# Patient Record
Sex: Female | Born: 1990
Health system: Southern US, Community
[De-identification: ages and names within clinical notes are randomized; demographics above are authoritative.]

## PROBLEM LIST (undated history)

## (undated) DIAGNOSIS — J302 Other seasonal allergic rhinitis: Secondary | ICD-10-CM

## (undated) DIAGNOSIS — J329 Chronic sinusitis, unspecified: Secondary | ICD-10-CM

---

## 2009-03-22 ENCOUNTER — Ambulatory Visit: Payer: Self-pay | Admitting: Internal Medicine

## 2010-06-15 ENCOUNTER — Emergency Department: Payer: Self-pay | Admitting: Unknown Physician Specialty

## 2012-04-18 ENCOUNTER — Observation Stay: Payer: Self-pay | Admitting: Obstetrics and Gynecology

## 2012-04-18 LAB — CBC WITH DIFFERENTIAL/PLATELET
Basophil #: 0 10*3/uL (ref 0.0–0.1)
Basophil %: 0.3 %
Eosinophil #: 0 10*3/uL (ref 0.0–0.7)
Eosinophil %: 0.4 %
HCT: 33.1 % — ABNORMAL LOW (ref 35.0–47.0)
Lymphocyte #: 1.5 10*3/uL (ref 1.0–3.6)
Lymphocyte %: 18.5 %
MCH: 28.6 pg (ref 26.0–34.0)
MCV: 88 fL (ref 80–100)
Monocyte #: 0.6 x10 3/mm (ref 0.2–0.9)
Neutrophil #: 6 10*3/uL (ref 1.4–6.5)
RDW: 12.7 % (ref 11.5–14.5)

## 2012-05-09 ENCOUNTER — Ambulatory Visit: Payer: Self-pay | Admitting: Obstetrics and Gynecology

## 2012-05-09 LAB — CBC WITH DIFFERENTIAL/PLATELET
Eosinophil %: 0.4 %
HCT: 35.4 % (ref 35.0–47.0)
MCH: 29.7 pg (ref 26.0–34.0)
MCHC: 34 g/dL (ref 32.0–36.0)
Monocyte %: 8.9 %
Neutrophil #: 6.8 10*3/uL — ABNORMAL HIGH (ref 1.4–6.5)
Neutrophil %: 70 %
Platelet: 290 10*3/uL (ref 150–440)
WBC: 9.8 10*3/uL (ref 3.6–11.0)

## 2012-05-10 ENCOUNTER — Inpatient Hospital Stay: Payer: Self-pay | Admitting: Obstetrics and Gynecology

## 2012-05-11 LAB — HEMATOCRIT: HCT: 30 % — ABNORMAL LOW (ref 35.0–47.0)

## 2014-08-02 NOTE — Op Note (Signed)
PATIENT NAME:  Margaret Wagner, Margaret Wagner MR#:  [id number] DATE OF BIRTH:  01/15/1991  DATE OF PROCEDURE:  05/10/2012  PREOPERATIVE DIAGNOSES:  1. Term intrauterine pregnancy at 39 weeks, three days gestation.  2. Homero Fellers breech presentation.   POSTOPERATIVE DIAGNOSES: 1. Term intrauterine pregnancy at 39 weeks, three days gestation.  2. Homero Fellers breech presentation.   OPERATION PERFORMED: Primary low transverse c-section via Pfannenstiel skin incision.   ANESTHESIA USED: Spinal.  PRIMARY SURGEON: Florina Ou. Bonney Aid, MD  ASSISTANT: Bobbye Charleston, MD  PREOPERATIVE ANTIBIOTICS: Ancef 2 grams.  ESTIMATED BLOOD LOSS: 600 mL   OPERATIVE FLUIDS: 750 mL of crystalloid.   URINE OUTPUT: 150 mL of clear urine.   COMPLICATIONS: None.   INTRAOPERATIVE FINDINGS: Normal tubes, ovaries, and uterus. Nuchal cord x 1 was encountered during the delivery. The infant was in the frank breech presentation as had been noted on prior ultrasounds. The delivery resulted in the birth of a live-born female infant weighing 3130 grams, 6 pounds 14 ounces, Apgars 9 and 9.   DRAINS OR TUBES: Foley to gravity drainage, On-Q catheter system.   IMPLANTS: None.   SPECIMENS REMOVED: None.   CONDITION FOLLOWING PROCEDURE: Stable.   COMPLICATIONS: None.   PROCEDURE IN DETAIL: The risks, benefits, and alternatives of the procedure were discussed with the patient prior to proceeding to the Operating Room. The patient had previously undergone a failed attempt at version approximately three weeks prior to her C-section. The fetus did not overt spontaneously and the decision was made to proceed with c-section for mode of delivery. After being taken to the Operating Room, spinal anesthesia was administered and the patient was positioned in the supine position. She was prepped and draped in the usual sterile fashion. A time-out was performed. The level of anesthetic was checked and noted to be adequate to proceed with the procedure. A  Pfannenstiel skin incision was made 2 cm above the pubic symphysis, carried down sharply to the level of the rectus fascia which was incised in the midline using a knife. The fascial incision was then extended using Mayo scissors. The superior border of the rectus fascia was grasped with 2 clamps. The underlying rectus muscles were dissected off the fascia using blunt dissection and the median raphe incised using Mayo scissors. The inferior border of the rectus fascia was dissected off the rectus muscle in a similar fashion. The midline was identified. The peritoneum was entered bluntly. The peritoneal incision was extended using manual traction. A bladder blade was then placed, and the bladder flap was created using Metzenbaum scissors and digital dissection. The bladder blade was then replaced to retract the bladder caudad. A hysterotomy incision was then made, low transverse, on the uterus. After scoring the uterus, the uterus was entered bluntly using the operator's finger. Amniotic fluid was noted to be clear. The infant was noted to be in the frank breech position. The breech was grasped, brought to the incision, and delivered atraumatically using fundal pressure. The left arm was then splinted, swept across the infant's chest, and the infant was rotated 180 degrees, and the right arm was delivered splinting it as well. Following delivery of the arms, the infant's head was flexed using Mauriceau-Smellie-Veit maneuver, and the head was delivered atraumatically using fundal pressure. The infant was suctioned, the cord was clamped and cut, and the infant was passed to a waiting pediatrician. Cord blood was obtained. The placenta was delivered using manual extraction. The uterus was then exteriorized, wiped clean of clots and debris,  and the hysterotomy incision was repaired using a two-layer closure of 0 Vicryl with the first layer being a running locked layer and the second a vertical imbricating. Following  hysterotomy closure, the uterus was returned to the abdomen, and the hysterotomy was reinspected and noted to be hemostatic. The peritoneal gutters were wiped clean of clots and debris using two moist laps. The peritoneum was closed using a 2-0 Vicryl in a running fashion. The On-Q catheter system was then placed superior to the incision in the usual fashion. Following On-Q catheter placement, the fascia was closed using a #1 looped PDS in a running fashion. Subcutaneous tissue was irrigated and hemostasis achieved using the Bovie. The skin was closed using Ensorb staples. Each On-Q catheter was then bolused with 5 mL of 0.5% bupivacaine each. The sponge, needle, and instrument counts were correct x 2. The patient tolerated the procedure well and was taken to the Recovery Room in stable condition.   ____________________________ Florina OuAndreas M. Bonney AidStaebler, MD ams:jm D: 05/12/2012 17:37:25 ET T: 05/13/2012 15:17:27 ET JOB#: [id number]  cc: Florina OuAndreas M. Bonney AidStaebler, MD, <Dictator> Lorrene ReidANDREAS M Orby Tangen MD ELECTRONICALLY SIGNED 05/19/2012 20:39

## 2014-08-20 NOTE — H&P (Signed)
L&D Evaluation:  History:   HPI 24 yo G1 at 6436 weeks gestation presenting for attempted external cephalic version.  Patient noted to be breech on in office US yesterday and confrimed to still be breech on bedside US today    Presents with External cephalic version    Patient's Medical History No Chronic Illness    Patient's Surgical History none    Medications Pre Natal Vitamins    Allergies NKDA    Social History none    Family History Non-Contributory   ROS:   ROS All systems were reviewed.  HEENT, CNS, GI, GU, Respiratory, CV, Renal and Musculoskeletal systems were found to be normal.   Exam:   Vital Signs stable    Urine Protein not completed    General no apparent distress    Abdomen gravid, non-tender    Estimated Fetal Weight Average for gestational age    Fetal Position breech    Edema no edema    Other Bedside US - single viable IUP, breech, anterior placenta, AFI of 16.8cm   Impression:   Impression External cephalic version   Plan:   Comments 1) Version     - monitor pre and post version (1-hr post)     - Terbutaline SQ 10 min prior     - AFI 16.8cm, normal size fetus 6lbs by leopolds     - CBC and hold sample   Electronic Signatures: Lorrene ReidStaebler, Lowen Barringer M (MD)  (Signed 07-Jan-14 12:50)  Authored: L&D Evaluation   Last Updated: 07-Jan-14 12:50 by Lorrene ReidStaebler, Lanitra Battaglini M (MD)

## 2014-09-27 ENCOUNTER — Ambulatory Visit
Admission: EM | Admit: 2014-09-27 | Discharge: 2014-09-27 | Disposition: A | Discharge status: Home / Self Care | Payer: BLUE CROSS/BLUE SHIELD | Attending: Family Medicine | Admitting: Family Medicine

## 2014-09-27 ENCOUNTER — Encounter: Payer: Self-pay | Admitting: Emergency Medicine

## 2014-09-27 DIAGNOSIS — J0111 Acute recurrent frontal sinusitis: Secondary | ICD-10-CM | POA: Diagnosis not present

## 2014-09-27 HISTORY — DX: Chronic sinusitis, unspecified: J32.9

## 2014-09-27 HISTORY — DX: Other seasonal allergic rhinitis: J30.2

## 2014-09-27 MED ORDER — AMOXICILLIN-POT CLAVULANATE 875-125 MG PO TABS: 1.0000 | ORAL_TABLET | Freq: Two times a day (BID) | ORAL | Status: AC

## 2014-09-27 NOTE — ED Provider Notes (Signed)
CSN: [id number]     Arrival date & time 09/27/14  1514 History   First MD Initiated Contact with Patient 09/27/14 1544     Chief Complaint  Patient presents with  . Recurrent Sinusitis   (Consider location/radiation/quality/duration/timing/severity/associated sxs/prior Treatment) HPI  23yo F with facial and tooth pain , periorbital pressure and a hx of sinus infections. Has not had a  fever yet but headaches are increasing. Had to leave work today because she couldn't keep going. Has seasonal allergies an and uses Zyrtec. Has never been taught to use flonase before- experiencing popping and squeaking in ears. Past Medical History  Diagnosis Date  . Recurrent sinus infections     seasonal allergies  . Seasonal allergies    Past Surgical History  Procedure Laterality Date  . Cesarean section     History reviewed. No pertinent family history. History  Substance Use Topics  . Smoking status: Not on file  . Smokeless tobacco: Not on file  . Alcohol Use: Not on file   OB History    No data available     Review of Systems Review of 10 systems negative for acute change except as referenced in HPI  Allergies  Review of patient's allergies indicates no known allergies.  Home Medications   Prior to Admission medications   Medication Sig Start Date End Date Taking? Authorizing Provider  amoxicillin-clavulanate (AUGMENTIN) 875-125 MG per tablet Take 1 tablet by mouth every 12 (twelve) hours. 09/27/14 10/07/14  Rae Halsted, PA-C   BP 116/77 mmHg  Pulse 84  Temp(Src) 98.9 F (37.2 C) (Tympanic)  Ht 5\' 1"  (1.549 m)  Wt 160 lb (72.576 kg)  BMI 30.25 kg/m2  SpO2 97% Physical Exam Constitutional -alert and oriented,well appearing, mild distress Head-atraumatic Eyes- conjunctiva normal, EOMI ,conjugate gaze Ears-TM dull, shift LR Nose- congestion , dullness to percussion of frontal sinuses; pain over upper teeth to percussion Mouth/throat- mucous membranes moist ,oropharynx  cobblestoning Neck- supple without glandular enlargement CV- regular rate, grossly normal heart sounds, Resp-no distress, normal respiratory effort,clear to auscultation bilaterally GI-,no distention GU- not examined MSK- no lower extremity tenderness, ambulatory Neuro- normal speech and language, y Skin-warm,dry ,intact; no rash noted Psych-mood and affect grossly normal;  ED Course  Procedures (including critical care time) Labs Review Labs Reviewed - No data to display  Imaging Review No results found.   MDM   1. Acute recurrent frontal sinusitis    Plan: 1.  Diagnosis reviewed with patient 2. Rx issued ; risks, benefits, potential side effects reviewed with patient 3. Recommend supportive treatment with OTC flonase/ guafenesin/tylenol/ibuprofen/fluids 4. F/u prn i f symptoms worsen or don't improve;  Augmentin ordered-Rx did not drop  Rae Halsted, PA-C 09/29/14 [phone]

## 2014-09-27 NOTE — ED Notes (Signed)
Patient stated she has been having  Dry cough, congestion, facial pain for 5 days

## 2014-09-27 NOTE — Discharge Instructions (Signed)
°Sinusitis °Sinusitis is redness, soreness, and inflammation of the paranasal sinuses. Paranasal sinuses are air pockets within the bones of your face (beneath the eyes, the middle of the forehead, or above the eyes). In healthy paranasal sinuses, mucus is able to drain out, and air is able to circulate through them by way of your nose. However, when your paranasal sinuses are inflamed, mucus and air can become trapped. This can allow bacteria and other germs to grow and cause infection. °Sinusitis can develop quickly and last only a short time (acute) or continue over a long period (chronic). Sinusitis that lasts for more than 12 weeks is considered chronic.  °CAUSES  °Causes of sinusitis include: °· Allergies. °· Structural abnormalities, such as displacement of the cartilage that separates your nostrils (deviated septum), which can decrease the air flow through your nose and sinuses and affect sinus drainage. °· Functional abnormalities, such as when the small hairs (cilia) that line your sinuses and help remove mucus do not work properly or are not present. °SIGNS AND SYMPTOMS  °Symptoms of acute and chronic sinusitis are the same. The primary symptoms are pain and pressure around the affected sinuses. Other symptoms include: °· Upper toothache. °· Earache. °· Headache. °· Bad breath. °· Decreased sense of smell and taste. °· A cough, which worsens when you are lying flat. °· Fatigue. °· Fever. °· Thick drainage from your nose, which often is green and may contain pus (purulent). °· Swelling and warmth over the affected sinuses. °DIAGNOSIS  °Your health care provider will perform a physical exam. During the exam, your health care provider may: °· Look in your nose for signs of abnormal growths in your nostrils (nasal polyps). °· Tap over the affected sinus to check for signs of infection. °· View the inside of your sinuses (endoscopy) using an imaging device that has a light attached (endoscope). °If your  health care provider suspects that you have chronic sinusitis, one or more of the following tests may be recommended: °· Allergy tests. °· Nasal culture. A sample of mucus is taken from your nose, sent to a lab, and screened for bacteria. °· Nasal cytology. A sample of mucus is taken from your nose and examined by your health care provider to determine if your sinusitis is related to an allergy. °TREATMENT  °Most cases of acute sinusitis are related to a viral infection and will resolve on their own within 10 days. Sometimes medicines are prescribed to help relieve symptoms (pain medicine, decongestants, nasal steroid sprays, or saline sprays).  °However, for sinusitis related to a bacterial infection, your health care provider will prescribe antibiotic medicines. These are medicines that will help kill the bacteria causing the infection.  °Rarely, sinusitis is caused by a fungal infection. In theses cases, your health care provider will prescribe antifungal medicine. °For some cases of chronic sinusitis, surgery is needed. Generally, these are cases in which sinusitis recurs more than 3 times per year, despite other treatments. °HOME CARE INSTRUCTIONS  °· Drink plenty of water. Water helps thin the mucus so your sinuses can drain more easily. °· Use a humidifier. °· Inhale steam 3 to 4 times a day (for example, sit in the bathroom with the shower running). °· Apply a warm, moist washcloth to your face 3 to 4 times a day, or as directed by your health care provider. °· Use saline nasal sprays to help moisten and clean your sinuses. °· Take medicines only as directed by your health care provider. °·   If you were prescribed either an antibiotic or antifungal medicine, finish it all even if you start to feel better. °SEEK IMMEDIATE MEDICAL CARE IF: °· You have increasing pain or severe headaches. °· You have nausea, vomiting, or drowsiness. °· You have swelling around your face. °· You have vision problems. °· You have  a stiff neck. °· You have difficulty breathing. °MAKE SURE YOU:  °· Understand these instructions. °· Will watch your condition. °· Will get help right away if you are not doing well or get worse. °Document Released: 03/29/2005 Document Revised: 08/13/2013 Document Reviewed: 04/13/2011 °Exit[employer]® Patient Information ©2015 [employer]. This information is not intended to replace advice given to you by your health care provider. Make sure you discuss any questions you have with your health care provider. °Barotitis Media °Barotitis media is inflammation of your middle ear. This occurs when the auditory tube (eustachian tube) leading from the back of your nose (nasopharynx) to your eardrum is blocked. This blockage may result from a cold, environmental allergies, or an upper respiratory infection. Unresolved barotitis media may lead to damage or hearing loss (barotrauma), which may become permanent. °HOME CARE INSTRUCTIONS  °· Use medicines as recommended by your health care provider. Over-the-counter medicines will help unblock the canal and can help during times of air travel. °· Do not put anything into your ears to clean or unplug them. Eardrops will not be helpful. °· Do not swim, dive, or fly until your health care provider says it is all right to do so. If these activities are necessary, chewing gum with frequent, forceful swallowing may help. It is also helpful to hold your nose and gently blow to pop your ears for equalizing pressure changes. This forces air into the eustachian tube. °· Only take over-the-counter or prescription medicines for pain, discomfort, or fever as directed by your health care provider. °· A decongestant may be helpful in decongesting the middle ear and make pressure equalization easier. °SEEK MEDICAL CARE IF: °· You experience a serious form of dizziness in which you feel as if the room is spinning and you feel nauseated (vertigo). °· Your symptoms only involve one ear. °SEEK  IMMEDIATE MEDICAL CARE IF:  °· You develop a severe headache, dizziness, or severe ear pain. °· You have bloody or pus-like drainage from your ears. °· You develop a fever. °· Your problems do not improve or become worse. °MAKE SURE YOU:  °· Understand these instructions. °· Will watch your condition. °· Will get help right away if you are not doing well or get worse. °Document Released: 03/26/2000 Document Revised: 01/17/2013 Document Reviewed: 10/24/2012 °ExitCare® Patient Information ©2015 [employer]. This information is not intended to replace advice given to you by your health care provider. Make sure you discuss any questions you have with your health care provider. °

## 2014-09-29 ENCOUNTER — Encounter: Payer: Self-pay | Admitting: Physician Assistant

## 2014-10-02 ENCOUNTER — Telehealth: Payer: Self-pay | Admitting: Emergency Medicine

## 2014-10-02 NOTE — ED Notes (Signed)
Patient called stating that she's been having some vaginal itching while taking her Augmentin.  Dr. Allena Katz reviewed her chart.  Diflucan 150mg  1 tablet once with no refills was called into [facility] in [place].  Patient was informed to pick-up her prescription at [facility] in [facility] and to follow-up here or with her PCP if her symptoms do not improve or worsen.  Patient verbalized understanding.

## 2015-04-24 ENCOUNTER — Encounter: Payer: Self-pay | Admitting: Family Medicine

## 2015-04-24 ENCOUNTER — Ambulatory Visit (INDEPENDENT_AMBULATORY_CARE_PROVIDER_SITE_OTHER): Payer: BLUE CROSS/BLUE SHIELD | Admitting: Family Medicine

## 2015-04-24 VITALS — BP 120/80 | HR 80 | Ht 61.0 in | Wt 176.0 lb

## 2015-04-24 DIAGNOSIS — N342 Other urethritis: Secondary | ICD-10-CM

## 2015-04-24 DIAGNOSIS — B379 Candidiasis, unspecified: Secondary | ICD-10-CM | POA: Diagnosis not present

## 2015-04-24 LAB — POCT URINALYSIS DIPSTICK
Bilirubin, UA: NEGATIVE
Glucose, UA: NEGATIVE
Ketones, UA: NEGATIVE
Leukocytes, UA: NEGATIVE
Nitrite, UA: NEGATIVE
PROTEIN UA: NEGATIVE
RBC UA: NEGATIVE
SPEC GRAV UA: 1.02
UROBILINOGEN UA: 0.2
pH, UA: 6

## 2015-04-24 MED ORDER — FLUCONAZOLE 150 MG PO TABS: 150.0000 mg | ORAL_TABLET | Freq: Once | ORAL | Status: DC

## 2015-04-24 MED ORDER — SULFAMETHOXAZOLE-TRIMETHOPRIM 800-160 MG PO TABS: 1.0000 | ORAL_TABLET | Freq: Two times a day (BID) | ORAL | Status: DC

## 2015-04-24 NOTE — Progress Notes (Signed)
Name: Margaret Wagner   MRN: [id number]    DOB: 1990-06-23   Date:04/24/2015       Progress Note  Subjective  Chief Complaint  Chief Complaint  Patient presents with  . Urinary Tract Infection    doesn't feel like she is emptying bladder when going to restroom and pressure at the end of stream    Urinary Tract Infection  This is a new problem. The current episode started yesterday. The problem occurs intermittently. The problem has been gradually worsening. The quality of the pain is described as burning. The pain is at a severity of 2/10. The pain is mild. There has been no fever. Associated symptoms include a discharge, frequency and urgency. Pertinent negatives include no chills, flank pain, hematuria, hesitancy, nausea, possible pregnancy, sweats or vomiting. She has tried acetaminophen for the symptoms. The treatment provided no relief. Her past medical history is significant for recurrent UTIs.    No problem-specific assessment & plan notes found for this encounter.   Past Medical History  Diagnosis Date  . Recurrent sinus infections     seasonal allergies  . Seasonal allergies     Past Surgical History  Procedure Laterality Date  . Cesarean section      Family History  Problem Relation Age of Onset  . Diabetes Father   . Heart disease Paternal Grandmother     Social History   Social History  . Marital Status: Single    Spouse Name: N/A  . Number of Children: N/A  . Years of Education: N/A   Occupational History  . Not on file.   Social History Main Topics  . Smoking status: Former Games developermoker  . Smokeless tobacco: Not on file  . Alcohol Use: 0.0 oz/week    0 Standard drinks or equivalent per week  . Drug Use: No  . Sexual Activity: Yes   Other Topics Concern  . Not on file   Social History Narrative    No Known Allergies   Review of Systems  Constitutional: Negative for fever, chills, weight loss and malaise/fatigue.  HENT: Negative for ear  discharge, ear pain and sore throat.   Eyes: Negative for blurred vision.  Respiratory: Negative for cough, sputum production, shortness of breath and wheezing.   Cardiovascular: Negative for chest pain, palpitations and leg swelling.  Gastrointestinal: Negative for heartburn, nausea, vomiting, abdominal pain, diarrhea, constipation, blood in stool and melena.  Genitourinary: Positive for urgency and frequency. Negative for dysuria, hesitancy, hematuria and flank pain.  Musculoskeletal: Negative for myalgias, back pain, joint pain and neck pain.  Skin: Negative for rash.  Neurological: Negative for dizziness, tingling, sensory change, focal weakness and headaches.  Endo/Heme/Allergies: Negative for environmental allergies and polydipsia. Does not bruise/bleed easily.  Psychiatric/Behavioral: Negative for depression and suicidal ideas. The patient is not nervous/anxious and does not have insomnia.      Objective  Filed Vitals:   04/24/15 1518  BP: 120/80  Pulse: 80  Height: 5\' 1"  (1.549 m)  Weight: 176 lb (79.833 kg)    Physical Exam  Constitutional: She is well-developed, well-nourished, and in no distress. No distress.  HENT:  Head: Normocephalic and atraumatic.  Right Ear: External ear normal.  Left Ear: External ear normal.  Nose: Nose normal.  Mouth/Throat: Oropharynx is clear and moist.  Eyes: Conjunctivae and EOM are normal. Pupils are equal, round, and reactive to light. Right eye exhibits no discharge. Left eye exhibits no discharge.  Neck: Normal range of motion. Neck supple.  No JVD present. No thyromegaly present.  Cardiovascular: Normal rate, regular rhythm, normal heart sounds and intact distal pulses.  Exam reveals no gallop and no friction rub.   No murmur heard. Pulmonary/Chest: Effort normal and breath sounds normal.  Abdominal: Soft. Bowel sounds are normal. She exhibits no mass. There is no tenderness. There is no guarding.  Musculoskeletal: Normal range of  motion. She exhibits no edema.  Lymphadenopathy:    She has no cervical adenopathy.  Neurological: She is alert. She has normal reflexes.  Skin: Skin is warm and dry. She is not diaphoretic.  Psychiatric: Mood and affect normal.  Nursing note and vitals reviewed.     Assessment & Plan  Problem List Items Addressed This Visit    None    Visit Diagnoses    Urethritis    -  Primary    Relevant Medications    sulfamethoxazole-trimethoprim (BACTRIM DS,SEPTRA DS) 800-160 MG tablet    Other Relevant Orders    POCT Urinalysis Dipstick (Completed)    Candida albicans infection        Relevant Medications    sulfamethoxazole-trimethoprim (BACTRIM DS,SEPTRA DS) 800-160 MG tablet    fluconazole (DIFLUCAN) 150 MG tablet         Dr. Hayden [facility]  04/24/2015

## 2015-05-08 ENCOUNTER — Ambulatory Visit (INDEPENDENT_AMBULATORY_CARE_PROVIDER_SITE_OTHER): Payer: BLUE CROSS/BLUE SHIELD | Admitting: Family Medicine

## 2015-05-08 ENCOUNTER — Encounter: Payer: Self-pay | Admitting: Family Medicine

## 2015-05-08 ENCOUNTER — Ambulatory Visit: Payer: BLUE CROSS/BLUE SHIELD | Admitting: Family Medicine

## 2015-05-08 VITALS — BP 120/70 | HR 98 | Temp 98.4°F | Ht 61.0 in | Wt 169.0 lb

## 2015-05-08 DIAGNOSIS — J029 Acute pharyngitis, unspecified: Secondary | ICD-10-CM

## 2015-05-08 MED ORDER — AMOXICILLIN 500 MG PO CAPS: 500.0000 mg | ORAL_CAPSULE | Freq: Three times a day (TID) | ORAL | Status: DC

## 2015-05-08 NOTE — Progress Notes (Signed)
Name: Margaret Wagner   MRN: [id number]    DOB: [birth year]   Date:05/08/2015       Progress Note  Subjective  Chief Complaint  Chief Complaint  Patient presents with  . Sinusitis    congestion, sore throat, fever and chills- 101    Sinusitis This is a new problem. The current episode started yesterday. Associated symptoms include chills, coughing, diaphoresis, headaches, a sore throat and swollen glands. Pertinent negatives include no congestion, ear pain, neck pain, shortness of breath or sinus pressure. Past treatments include acetaminophen and oral decongestants. The treatment provided no relief.    No problem-specific assessment & plan notes found for this encounter.   Past Medical History  Diagnosis Date  . Recurrent sinus infections     seasonal allergies  . Seasonal allergies     Past Surgical History  Procedure Laterality Date  . Cesarean section      Family History  Problem Relation Age of Onset  . Diabetes Father   . Heart disease Paternal Grandmother     Social History   Social History  . Marital Status: Single    Spouse Name: N/A  . Number of Children: N/A  . Years of Education: N/A   Occupational History  . Not on file.   Social History Main Topics  . Smoking status: Former Games developer  . Smokeless tobacco: Not on file  . Alcohol Use: 0.0 oz/week    0 Standard drinks or equivalent per week  . Drug Use: No  . Sexual Activity: Yes   Other Topics Concern  . Not on file   Social History Narrative    No Known Allergies   Review of Systems  Constitutional: Positive for chills and diaphoresis. Negative for fever, weight loss and malaise/fatigue.  HENT: Positive for sore throat. Negative for congestion, ear discharge, ear pain and sinus pressure.   Eyes: Negative for blurred vision.  Respiratory: Positive for cough. Negative for sputum production, shortness of breath and wheezing.   Cardiovascular: Negative for chest pain, palpitations and leg  swelling.  Gastrointestinal: Negative for heartburn, nausea, abdominal pain, diarrhea, constipation, blood in stool and melena.  Genitourinary: Negative for dysuria, urgency, frequency and hematuria.  Musculoskeletal: Positive for myalgias. Negative for back pain, joint pain and neck pain.  Skin: Negative for rash.  Neurological: Positive for headaches. Negative for dizziness, tingling, sensory change and focal weakness.  Endo/Heme/Allergies: Negative for environmental allergies and polydipsia. Does not bruise/bleed easily.  Psychiatric/Behavioral: Negative for depression and suicidal ideas. The patient is not nervous/anxious and does not have insomnia.      Objective  Filed Vitals:   05/08/15 0831  BP: 120/70  Pulse: 98  Temp: 98.4 F (36.9 C)  TempSrc: Oral  Height:  (1.549 m)  Weight: 169 lb (76.658 kg)    Physical Exam  Constitutional: She is well-developed, well-nourished, and in no distress. No distress.  HENT:  Head: Normocephalic and atraumatic.  Right Ear: External ear normal.  Left Ear: External ear normal.  Nose: Nose normal.  Mouth/Throat: Oropharyngeal exudate, posterior oropharyngeal edema and posterior oropharyngeal erythema present. No tonsillar abscesses.  Eyes: Conjunctivae and EOM are normal. Pupils are equal, round, and reactive to light. Right eye exhibits no discharge. Left eye exhibits no discharge.  Neck: Normal range of motion. Neck supple. No JVD present. No thyromegaly present.  Cardiovascular: Normal rate, regular rhythm, normal heart sounds and intact distal pulses.  Exam reveals no gallop and no friction rub.   No  murmur heard. Pulmonary/Chest: Effort normal and breath sounds normal.  Abdominal: Soft. Bowel sounds are normal. She exhibits no mass. There is no tenderness. There is no guarding.  Musculoskeletal: Normal range of motion. She exhibits no edema.  Lymphadenopathy:    She has no cervical adenopathy.  Neurological: She is alert. She  has normal reflexes.  Skin: Skin is warm and dry. She is not diaphoretic.  Psychiatric: Mood and affect normal.      Assessment & Plan  Problem List Items Addressed This Visit    None    Visit Diagnoses    Pharyngitis    -  Primary    Relevant Medications    amoxicillin (AMOXIL) 500 MG capsule         Dr. Hayden [facility]  05/08/2015

## 2015-06-20 ENCOUNTER — Other Ambulatory Visit: Payer: Self-pay

## 2015-06-20 MED ORDER — OSELTAMIVIR PHOSPHATE 75 MG PO CAPS: 75.0000 mg | ORAL_CAPSULE | Freq: Two times a day (BID) | ORAL | Status: DC

## 2015-07-09 ENCOUNTER — Encounter: Payer: Self-pay | Admitting: Family Medicine

## 2015-07-09 ENCOUNTER — Ambulatory Visit (INDEPENDENT_AMBULATORY_CARE_PROVIDER_SITE_OTHER): Payer: BLUE CROSS/BLUE SHIELD | Admitting: Family Medicine

## 2015-07-09 VITALS — BP 110/80 | HR 96 | Temp 98.0°F | Ht 61.0 in | Wt 164.0 lb

## 2015-07-09 DIAGNOSIS — J0100 Acute maxillary sinusitis, unspecified: Secondary | ICD-10-CM

## 2015-07-09 DIAGNOSIS — N760 Acute vaginitis: Secondary | ICD-10-CM

## 2015-07-09 MED ORDER — AMOXICILLIN-POT CLAVULANATE 875-125 MG PO TABS: 1.0000 | ORAL_TABLET | Freq: Two times a day (BID) | ORAL | Status: DC

## 2015-07-09 MED ORDER — FLUCONAZOLE 150 MG PO TABS: 150.0000 mg | ORAL_TABLET | Freq: Once | ORAL | Status: DC

## 2015-07-09 NOTE — Progress Notes (Signed)
Name: Leonides GrillsStephanie L Audrielle Vankuren   MRN: [id number]    DOB: 1991/03/10   Date:07/09/2015       Progress Note  Subjective  Chief Complaint  Chief Complaint  Patient presents with  . Sinusitis    had a round of Tamiflu due to child being Dx with flu on 06/20/2015- took that and got better but has since started with sinus pressure and stuffy nose x 4 days    Sinusitis This is a new problem. The current episode started in the past 7 days. The problem has been waxing and waning since onset. There has been no fever. Her pain is at a severity of 3/10 ("teeth"). The pain is mild. Associated symptoms include congestion, coughing, ear pain, headaches, sinus pressure, a sore throat and swollen glands. Pertinent negatives include no chills, diaphoresis, hoarse voice, neck pain or shortness of breath.    No problem-specific assessment & plan notes found for this encounter.   Past Medical History  Diagnosis Date  . Recurrent sinus infections     seasonal allergies  . Seasonal allergies     Past Surgical History  Procedure Laterality Date  . Cesarean section      Family History  Problem Relation Age of Onset  . Diabetes Father   . Heart disease Paternal Grandmother     Social History   Social History  . Marital Status: Single    Spouse Name: N/A  . Number of Children: N/A  . Years of Education: N/A   Occupational History  . Not on file.   Social History Main Topics  . Smoking status: Former Games developermoker  . Smokeless tobacco: Not on file  . Alcohol Use: 0.0 oz/week    0 Standard drinks or equivalent per week  . Drug Use: No  . Sexual Activity: Yes   Other Topics Concern  . Not on file   Social History Narrative    No Known Allergies   Review of Systems  Constitutional: Negative for fever, chills, weight loss, malaise/fatigue and diaphoresis.  HENT: Positive for congestion, ear pain, sinus pressure and sore throat. Negative for ear discharge and hoarse voice.   Eyes: Negative for  blurred vision.  Respiratory: Positive for cough. Negative for sputum production, shortness of breath and wheezing.   Cardiovascular: Negative for chest pain, palpitations and leg swelling.  Gastrointestinal: Negative for heartburn, nausea, abdominal pain, diarrhea, constipation, blood in stool and melena.  Genitourinary: Negative for dysuria, urgency, frequency and hematuria.  Musculoskeletal: Negative for myalgias, back pain, joint pain and neck pain.  Skin: Negative for rash.  Neurological: Positive for headaches. Negative for dizziness, tingling, sensory change and focal weakness.  Endo/Heme/Allergies: Negative for environmental allergies and polydipsia. Does not bruise/bleed easily.  Psychiatric/Behavioral: Negative for depression and suicidal ideas. The patient is not nervous/anxious and does not have insomnia.      Objective  Filed Vitals:   07/09/15 0917  BP: 110/80  Pulse: 96  Temp: 98 F (36.7 C)  TempSrc: Oral  Height: 5\' 1"  (1.549 m)  Weight: 164 lb (74.39 kg)    Physical Exam  Constitutional: She is well-developed, well-nourished, and in no distress. No distress.  HENT:  Head: Normocephalic and atraumatic.  Right Ear: Tympanic membrane, external ear and ear canal normal.  Left Ear: Tympanic membrane, external ear and ear canal normal.  Nose: Right sinus exhibits maxillary sinus tenderness. Left sinus exhibits maxillary sinus tenderness.  Mouth/Throat: Oropharynx is clear and moist.  Eyes: Conjunctivae and EOM are normal. Pupils  are equal, round, and reactive to light. Right eye exhibits no discharge. Left eye exhibits no discharge.  Neck: Normal range of motion. Neck supple. No JVD present. No thyromegaly present.  Cardiovascular: Normal rate, regular rhythm, normal heart sounds and intact distal pulses.  Exam reveals no gallop and no friction rub.   No murmur heard. Pulmonary/Chest: Effort normal and breath sounds normal.  Abdominal: Soft. Bowel sounds are normal.  She exhibits no mass. There is no tenderness. There is no guarding.  Musculoskeletal: Normal range of motion. She exhibits no edema.  Lymphadenopathy:    She has no cervical adenopathy.  Neurological: She is alert.  Skin: Skin is warm and dry. She is not diaphoretic.  Psychiatric: Mood and affect normal.  Nursing note and vitals reviewed.     Assessment & Plan  Problem List Items Addressed This Visit    None    Visit Diagnoses    Acute maxillary sinusitis, recurrence not specified    -  Primary    Relevant Medications    amoxicillin-clavulanate (AUGMENTIN) 875-125 MG tablet    fluconazole (DIFLUCAN) 150 MG tablet    Vaginitis        Relevant Medications    fluconazole (DIFLUCAN) 150 MG tablet         Dr. [facility]  07/09/2015

## 2016-06-02 ENCOUNTER — Ambulatory Visit
Admission: EM | Admit: 2016-06-02 | Discharge: 2016-06-02 | Disposition: A | Discharge status: Home / Self Care | Payer: BLUE CROSS/BLUE SHIELD | Attending: Family Medicine | Admitting: Family Medicine

## 2016-06-02 ENCOUNTER — Encounter: Payer: Self-pay | Admitting: *Deleted

## 2016-06-02 DIAGNOSIS — A6009 Herpesviral infection of other urogenital tract: Secondary | ICD-10-CM | POA: Diagnosis not present

## 2016-06-02 DIAGNOSIS — N764 Abscess of vulva: Secondary | ICD-10-CM

## 2016-06-02 MED ORDER — VALACYCLOVIR HCL 1 G PO TABS: 1000.0000 mg | ORAL_TABLET | Freq: Two times a day (BID) | ORAL | 0 refills | Status: AC

## 2016-06-02 MED ORDER — MUPIROCIN 2 % EX OINT: 1.0000 "application " | TOPICAL_OINTMENT | Freq: Three times a day (TID) | CUTANEOUS | 0 refills | Status: DC

## 2016-06-02 NOTE — ED Provider Notes (Signed)
[facility]    CSN: [id number] Arrival date & time: 06/02/16  1551     History   Chief Complaint Chief Complaint  Patient presents with  . Abscess    HPI Margaret Wagner is a 26 y.o. female.   Patient is a 26 year old white female who states that she has noticed which she calls an abscess on the left labia. She reports it started on Monday and Tuesday she knows more increased pressure and pain and today she stenosis straining. She became concerned and came in to be seen. She also states that she had a temperature of 101 at home and that was another concern. She's never had abscesses before she's had a C-section in December for her first child. No known drug allergies she does not smoke. There is no pertinent family medical history relevant to today's visit. Her father diabetic and she never smoked.   The history is provided by the patient. No language interpreter was used.  Abscess  Location:  Pelvis Pelvic abscess location:  Groin Abscess quality: draining   Red streaking: no   Duration:  2 days Progression:  Worsening Chronicity:  New   Past Medical History:  Diagnosis Date  . Recurrent sinus infections    seasonal allergies  . Seasonal allergies     There are no active problems to display for this patient.   Past Surgical History:  Procedure Laterality Date  . CESAREAN SECTION      OB History    No data available       Home Medications    Prior to Admission medications   Medication Sig Start Date End Date Taking? Authorizing Provider  amoxicillin-clavulanate (AUGMENTIN) 875-125 MG tablet Take 1 tablet by mouth 2 (two) times daily. 07/09/15   Duanne Limerickeanna C Jones, MD  fluconazole (DIFLUCAN) 150 MG tablet Take 1 tablet (150 mg total) by mouth once. 07/09/15   Duanne Limerickeanna C Jones, MD  mupirocin ointment (BACTROBAN) 2 % Apply 1 application topically 3 (three) times daily. 06/02/16   Hassan RowanEugene Kendelle Schweers, MD  valACYclovir (VALTREX) 1000 MG tablet Take 1 tablet (1,000  mg total) by mouth 2 (two) times daily. 06/02/16 06/09/16  Hassan RowanEugene Landri Dorsainvil, MD    Family History Family History  Problem Relation Age of Onset  . Diabetes Father   . Heart disease Paternal Grandmother     Social History Social History  Substance Use Topics  . Smoking status: Never Smoker  . Smokeless tobacco: Never Used  . Alcohol use 0.0 oz/week     Allergies   Patient has no known allergies.   Review of Systems Review of Systems  HENT: Negative for sinus pressure.   Genitourinary: Positive for vaginal discharge and vaginal pain.  All other systems reviewed and are negative.    Physical Exam Triage Vital Signs ED Triage Vitals  Enc Vitals Group     BP 06/02/16 1606 (!) 139/97     Pulse Rate 06/02/16 1606 93     Resp 06/02/16 1606 16     Temp 06/02/16 1606 (!) 100.6 F (38.1 C)     Temp Source 06/02/16 1606 Oral     SpO2 06/02/16 1606 100 %     Weight 06/02/16 1607 180 lb (81.6 kg)     Height 06/02/16 1607 5\' 1"  (1.549 m)     Head Circumference --      Peak Flow --      Pain Score 06/02/16 1610 7     Pain Loc --  Pain Edu? --      Excl. in GC? --    No data found.   Updated Vital Signs BP (!) 139/97 (BP Location: Left Arm)   Pulse 93   Temp (!) 100.6 F (38.1 C) (Oral)   Resp 16   Ht 5\' 1"  (1.549 m)   Wt 180 lb (81.6 kg)   SpO2 100%   BMI 34.01 kg/m   Visual Acuity Right Eye Distance:   Left Eye Distance:   Bilateral Distance:    Right Eye Near:   Left Eye Near:    Bilateral Near:     Physical Exam  Constitutional: She appears well-developed and well-nourished.  HENT:  Head: Normocephalic and atraumatic.  Eyes: Pupils are equal, round, and reactive to light.  Pulmonary/Chest: Effort normal.  Genitourinary:    There is tenderness and lesion on the left labia.  Genitourinary Comments: Along the upper to mid left labia there is a area of redness and swelling and induration. However this also appears to be a coalescence of ulcers that  have opened now versus an abscess this really draining  Musculoskeletal: Normal range of motion.  Neurological: She is alert.  Skin: Skin is warm.  Psychiatric: She has a normal mood and affect.  Vitals reviewed.    UC Treatments / Results  Labs (all labs ordered are listed, but only abnormal results are displayed) Labs Reviewed  AEROBIC/ANAEROBIC CULTURE (SURGICAL/DEEP WOUND)  HSV CULTURE AND TYPING    EKG  EKG Interpretation None       Radiology No results found.  Procedures Procedures (including critical care time)  Medications Ordered in UC Medications - No data to display   Initial Impression / Assessment and Plan / UC Course  I have reviewed the triage vital signs and the nursing notes.  Pertinent labs & imaging results that were available during my care of the patient were reviewed by me and considered in my medical decision making (see chart for details).   discussed patient that I do not think this is truly an abscess will place her on Bactrim ointment that she complied to the area but I think this is the first episode of a gentle herpes lesion. Low-grade atypical because it's not on the true outside of the labia is a little bit on the inside but this looks more like herpes anything else. She is sexually active at this time since having a C-section and she does report that her partner does perform oral sex on her and that she did have sexual relations this past weekend. I've explained to her that even if his does to have gentle herpes or genital herpes lesions if he's performing oral sex and he has a fever blister this could be the source of transmission. We'll place on Valtrex 1 g twice a day for 5 days and we did a culture wound culture and we also did a herpes herpetic culture as well    Final Clinical Impressions(s) / UC Diagnoses   Final diagnoses:  Herpes genitalis in women  Abscess of left genital labia    New Prescriptions Discharge Medication List  as of 06/02/2016  4:58 PM    START taking these medications   Details  mupirocin ointment (BACTROBAN) 2 % Apply 1 application topically 3 (three) times daily., Starting Wed 06/02/2016, Normal    valACYclovir (VALTREX) 1000 MG tablet Take 1 tablet (1,000 mg total) by mouth 2 (two) times daily., Starting Wed 06/02/2016, Until Wed 06/09/2016, Normal  Note: This dictation was prepared with Dragon dictation along with smaller phrase technology. Any transcriptional errors that result from this process are unintentional.   Hassan Rowan, MD 06/02/16 [phone]

## 2016-06-02 NOTE — ED Triage Notes (Signed)
Patient started having symptoms of swelling, redness, and drainage located left of pubic area.

## 2016-06-04 LAB — HSV CULTURE AND TYPING

## 2016-06-08 ENCOUNTER — Telehealth: Payer: Self-pay

## 2016-06-08 ENCOUNTER — Encounter: Payer: Self-pay | Admitting: Family Medicine

## 2016-06-08 ENCOUNTER — Ambulatory Visit (INDEPENDENT_AMBULATORY_CARE_PROVIDER_SITE_OTHER): Payer: BLUE CROSS/BLUE SHIELD | Admitting: Family Medicine

## 2016-06-08 VITALS — BP 120/80 | HR 80 | Ht 61.0 in | Wt 180.0 lb

## 2016-06-08 DIAGNOSIS — F32A Depression, unspecified: Secondary | ICD-10-CM

## 2016-06-08 DIAGNOSIS — Z114 Encounter for screening for human immunodeficiency virus [HIV]: Secondary | ICD-10-CM

## 2016-06-08 DIAGNOSIS — F419 Anxiety disorder, unspecified: Secondary | ICD-10-CM

## 2016-06-08 DIAGNOSIS — B009 Herpesviral infection, unspecified: Secondary | ICD-10-CM | POA: Diagnosis not present

## 2016-06-08 DIAGNOSIS — F418 Other specified anxiety disorders: Secondary | ICD-10-CM | POA: Diagnosis not present

## 2016-06-08 DIAGNOSIS — F329 Major depressive disorder, single episode, unspecified: Secondary | ICD-10-CM

## 2016-06-08 LAB — AEROBIC/ANAEROBIC CULTURE W GRAM STAIN (SURGICAL/DEEP WOUND): Culture: NORMAL

## 2016-06-08 LAB — AEROBIC/ANAEROBIC CULTURE (SURGICAL/DEEP WOUND): SPECIAL REQUESTS: NORMAL

## 2016-06-08 MED ORDER — SERTRALINE HCL 50 MG PO TABS: 50.0000 mg | ORAL_TABLET | Freq: Every day | ORAL | 3 refills | Status: DC

## 2016-06-08 MED ORDER — ALPRAZOLAM 0.25 MG PO TABS: 0.2500 mg | ORAL_TABLET | Freq: Two times a day (BID) | ORAL | 0 refills | Status: DC | PRN

## 2016-06-08 NOTE — Progress Notes (Signed)
Name: Margaret Wagner   MRN: [id number]    DOB: 01-20-1991   Date:06/08/2016       Progress Note  Subjective  Chief Complaint  Chief Complaint  Patient presents with  . Follow-up    positive HSV at urgent care- did not start valacyclovir even after receiving test results "I wanted to discuss it with you"    Vaginal Pain  The patient's primary symptoms include genital lesions. The patient's pertinent negatives include no genital itching, genital odor, genital rash, missed menses, pelvic pain, vaginal bleeding or vaginal discharge. This is a new problem. The current episode started in the past 7 days. The problem occurs daily. The problem has been gradually improving. The pain is mild. The problem affects the left side. She is not pregnant. Pertinent negatives include no abdominal pain, back pain, chills, constipation, diarrhea, discolored urine, dysuria, fever, flank pain, frequency, headaches, hematuria, joint pain, joint swelling, nausea, painful intercourse, rash, sore throat or urgency. Treatments tried: topical antiviral. The treatment provided significant relief. She is sexually active. It is unknown whether or not her partner has an STD.  Depression       The patient presents with depression.  This is a new problem.  The current episode started in the past 7 days.   The onset quality is gradual.   The problem occurs daily.  The problem has been gradually worsening since onset.  Associated symptoms include decreased interest and sad.  Associated symptoms include no decreased concentration, no helplessness, no hopelessness, does not have insomnia, no myalgias, no headaches and no suicidal ideas.     Exacerbated by: illness.  Past treatments include nothing.  Past medical history includes anxiety and depression.     Pertinent negatives include no chronic fatigue syndrome. Anxiety  Presents for follow-up visit. Symptoms include excessive worry, irritability and panic. Patient reports no chest  pain, compulsions, confusion, decreased concentration, dizziness, insomnia, nausea, nervous/anxious behavior, palpitations, shortness of breath or suicidal ideas.   Her past medical history is significant for depression.    No problem-specific Assessment & Plan notes found for this encounter.   Past Medical History:  Diagnosis Date  . Recurrent sinus infections    seasonal allergies  . Seasonal allergies     Past Surgical History:  Procedure Laterality Date  . CESAREAN SECTION      Family History  Problem Relation Age of Onset  . Diabetes Father   . Heart disease Paternal Grandmother     Social History   Social History  . Marital status: Single    Spouse name: N/A  . Number of children: N/A  . Years of education: N/A   Occupational History  . Not on file.   Social History Main Topics  . Smoking status: Never Smoker  . Smokeless tobacco: Never Used  . Alcohol use 0.0 oz/week  . Drug use: No  . Sexual activity: Yes   Other Topics Concern  . Not on file   Social History Narrative  . No narrative on file    No Known Allergies  Outpatient Medications Prior to Visit  Medication Sig Dispense Refill  . mupirocin ointment (BACTROBAN) 2 % Apply 1 application topically 3 (three) times daily. 22 g 0  . valACYclovir (VALTREX) 1000 MG tablet Take 1 tablet (1,000 mg total) by mouth 2 (two) times daily. (Patient not taking: Reported on 06/08/2016) 14 tablet 0   No facility-administered medications prior to visit.     Review of Systems  Constitutional:  Positive for irritability. Negative for chills, fever, malaise/fatigue and weight loss.  HENT: Negative for ear discharge, ear pain and sore throat.   Eyes: Negative for blurred vision.  Respiratory: Negative for cough, sputum production, shortness of breath and wheezing.   Cardiovascular: Negative for chest pain, palpitations and leg swelling.  Gastrointestinal: Negative for abdominal pain, blood in stool,  constipation, diarrhea, heartburn, melena and nausea.  Genitourinary: Positive for vaginal pain. Negative for dysuria, flank pain, frequency, hematuria, missed menses, pelvic pain, urgency and vaginal discharge.  Musculoskeletal: Negative for back pain, joint pain, myalgias and neck pain.  Skin: Negative for rash.  Neurological: Negative for dizziness, tingling, sensory change, focal weakness and headaches.  Endo/Heme/Allergies: Negative for environmental allergies and polydipsia. Does not bruise/bleed easily.  Psychiatric/Behavioral: Negative for confusion, decreased concentration, depression and suicidal ideas. The patient is not nervous/anxious and does not have insomnia.      Objective  Vitals:   06/08/16 1343  BP: 120/80  Pulse: 80  Weight: 180 lb (81.6 kg)  Height: 5\' 1"  (1.549 m)    Physical Exam  Constitutional: She is well-developed, well-nourished, and in no distress. No distress.  HENT:  Head: Normocephalic and atraumatic.  Right Ear: External ear normal.  Left Ear: External ear normal.  Nose: Nose normal.  Mouth/Throat: Oropharynx is clear and moist.  Eyes: Conjunctivae and EOM are normal. Pupils are equal, round, and reactive to light. Right eye exhibits no discharge. Left eye exhibits no discharge.  Neck: Normal range of motion. Neck supple. No JVD present. No thyromegaly present.  Cardiovascular: Normal rate, regular rhythm, normal heart sounds and intact distal pulses.  Exam reveals no gallop and no friction rub.   No murmur heard. Pulmonary/Chest: Effort normal and breath sounds normal.  Abdominal: Soft. Bowel sounds are normal. She exhibits no mass. There is no tenderness. There is no guarding.  Genitourinary: Vulva exhibits erythema. Vulva exhibits no exudate, no lesion, no rash and no tenderness.  Musculoskeletal: Normal range of motion. She exhibits no edema.  Lymphadenopathy:    She has no cervical adenopathy.  Neurological: She is alert. She has normal  reflexes.  Skin: Skin is warm and dry. She is not diaphoretic.  Psychiatric: Mood and affect normal.  Nursing note and vitals reviewed.     Assessment & Plan  Problem List Items Addressed This Visit    None    Visit Diagnoses    Routine cultures positive for HSV2    -  Primary   cont treatment per urgent care/ discussed HPV immunization and HPV pap   Relevant Orders   HIV antibody   Encounter for screening for HIV       Relevant Orders   HIV antibody   Anxiety and depression       Relevant Medications   sertraline (ZOLOFT) 50 MG tablet   ALPRAZolam (XANAX) 0.25 MG tablet      Meds ordered this encounter  Medications  . sertraline (ZOLOFT) 50 MG tablet    Sig: Take 1 tablet (50 mg total) by mouth daily.    Dispense:  30 tablet    Refill:  3    Start one half a day for 10 days  . ALPRAZolam (XANAX) 0.25 MG tablet    Sig: Take 1 tablet (0.25 mg total) by mouth 2 (two) times daily as needed for anxiety.    Dispense:  20 tablet    Refill:  0      Dr. Elizabeth Sauer [facility]  06/08/16

## 2016-06-08 NOTE — Telephone Encounter (Signed)
Pt called wanting to know if we tested her for HSV 1 and 2 while she was preg in 2013/14.  Adv. We did not.  She wanted to know should we have. Adv she didn't have a hx of it nor did she have an outbreak while preg.

## 2016-06-10 ENCOUNTER — Telehealth: Payer: Self-pay

## 2016-06-10 ENCOUNTER — Encounter: Payer: Self-pay | Admitting: Advanced Practice Midwife

## 2016-06-10 ENCOUNTER — Ambulatory Visit (INDEPENDENT_AMBULATORY_CARE_PROVIDER_SITE_OTHER): Payer: BLUE CROSS/BLUE SHIELD | Admitting: Advanced Practice Midwife

## 2016-06-10 VITALS — BP 124/76 | HR 88 | Ht 61.0 in | Wt 179.0 lb

## 2016-06-10 DIAGNOSIS — Z124 Encounter for screening for malignant neoplasm of cervix: Secondary | ICD-10-CM

## 2016-06-10 DIAGNOSIS — Z30430 Encounter for insertion of intrauterine contraceptive device: Secondary | ICD-10-CM

## 2016-06-10 MED ORDER — MISOPROSTOL 200 MCG PO TABS: ORAL_TABLET | ORAL | 0 refills | Status: DC

## 2016-06-10 NOTE — Telephone Encounter (Signed)
Pt called.  Partner is unable to be seen for a few weeks.  Wants to know if they need to wait until after he is tested to have sex or can they use protection before he's tested.

## 2016-06-10 NOTE — Progress Notes (Signed)
Subjective:     Margaret Wagner is a 26 y.o. female here for a PAP smear with HPV per her PCP.  Current complaints: she has questions regarding her recent diagnosis of HSV2. She also has questions about contraceptive management. She states that she has noticed side effects of weight gain, bloating, mood swings with her Mirena IUD. She has had it in for four years. She is considering non hormonal Paraguard IUD and would prefer to have the procedure done as soon as possible.    Gynecologic History No LMP recorded. Patient is not currently having periods (Reason: IUD). Contraception: IUD Last Pap: 2 years ago. Results were: normal Last mammogram: NA  Obstetric History OB History  No data available   History of Present Illness: Pt was diagnosed with HSV2 approximately 1 week ago. She had an initial outbreak which she states was painful and affected her with general feeling of malaise. Pt states the HSV lesion is healing and is no longer painful. She was seen by her PCP who recommended she come to her gyn for a PAP smear with HPV testing because it has been 2 years since her previous PAP . While the patient was here today she has concerns about her current birth control as stated in the subjective section above. Discussion of options: pt prefers IUD but would like to have the non hormonal option going forward.   Review of Systems Pertinent items are noted in HPI.    Objective:    General appearance: alert, cooperative, appears stated age and no distress Lungs: clear to auscultation bilaterally Heart: regular rate and rhythm, S1, S2 normal, no murmur, click, rub or gallop Abdomen: soft, non-tender; bowel sounds normal; no masses,  no organomegaly Pelvic: cervix normal in appearance, no adnexal masses or tenderness, no cervical motion tenderness, rectovaginal septum normal, uterus normal size, shape, and consistency, vagina normal without discharge and HSV lesion on left labia is not raised and  in the healing process Extremities: extremities normal, atraumatic, no cyanosis or edema Skin: no rashes or lesions except as noted on external genitalia Neurologic: Grossly normal    Assessment:    Healthy female with healing HSV lesion. PAP smear done. Contraceptive counseling done.   Plan:   Education reviewed: HSV course of illness and treatment options. Contraception: IUD. Schedule visit for Paraguard insertion Misoprostol 200 mcg prior to IUD insertion Follow up as needed for PAP smear results   Margaret Wagner, CNM

## 2016-06-10 NOTE — Telephone Encounter (Signed)
Please call patient and advise

## 2016-06-12 LAB — PAP IG AND HPV HIGH-RISK
HPV, HIGH-RISK: NEGATIVE
PAP Smear Comment: 0

## 2016-06-14 ENCOUNTER — Other Ambulatory Visit: Payer: Self-pay

## 2016-07-06 ENCOUNTER — Ambulatory Visit (INDEPENDENT_AMBULATORY_CARE_PROVIDER_SITE_OTHER): Payer: BLUE CROSS/BLUE SHIELD | Admitting: Family Medicine

## 2016-07-06 ENCOUNTER — Encounter: Payer: Self-pay | Admitting: Family Medicine

## 2016-07-06 VITALS — BP 120/80 | HR 70 | Ht 61.0 in | Wt 178.0 lb

## 2016-07-06 DIAGNOSIS — F32A Depression, unspecified: Secondary | ICD-10-CM

## 2016-07-06 DIAGNOSIS — F419 Anxiety disorder, unspecified: Secondary | ICD-10-CM

## 2016-07-06 DIAGNOSIS — F418 Other specified anxiety disorders: Secondary | ICD-10-CM | POA: Diagnosis not present

## 2016-07-06 DIAGNOSIS — F329 Major depressive disorder, single episode, unspecified: Secondary | ICD-10-CM

## 2016-07-06 MED ORDER — SERTRALINE HCL 50 MG PO TABS: 50.0000 mg | ORAL_TABLET | Freq: Every day | ORAL | 1 refills | Status: DC

## 2016-07-06 MED ORDER — SERTRALINE HCL 50 MG PO TABS: 50.0000 mg | ORAL_TABLET | Freq: Every day | ORAL | 3 refills | Status: DC

## 2016-07-06 NOTE — Progress Notes (Signed)
Name: Margaret Wagner   MRN: [id number]    DOB: 05-22-1990   Date:07/06/2016       Progress Note  Subjective  Chief Complaint  Chief Complaint  Patient presents with  . Follow-up    started sertraline and Xanax- is doing good on sertraline and only had to use Xanax once    Depression       The patient presents with depression.  This is a chronic problem.  The current episode started more than 1 month ago.   The onset quality is gradual.   The problem occurs daily.  The problem has been gradually improving since onset.  Associated symptoms include no decreased concentration, no fatigue, no helplessness, no hopelessness, does not have insomnia, not irritable, no restlessness, no decreased interest, no appetite change, no body aches, no myalgias (anx), no headaches, no indigestion, not sad and no suicidal ideas.     The symptoms are aggravated by nothing.  Past treatments include SSRIs - Selective serotonin reuptake inhibitors.  Compliance with treatment is good.  Previous treatment provided moderate relief.  Past medical history includes anxiety and depression.     Pertinent negatives include no chronic fatigue syndrome and no obsessive-compulsive disorder. Anxiety  Presents for follow-up visit. Symptoms include nervous/anxious behavior. Patient reports no chest pain, decreased concentration, depressed mood, dizziness, excessive worry, insomnia, nausea, palpitations, panic, restlessness, shortness of breath or suicidal ideas. Symptoms occur most days. The severity of symptoms is mild. The quality of sleep is good.   Her past medical history is significant for depression.    No problem-specific Assessment & Plan notes found for this encounter.   Past Medical History:  Diagnosis Date  . Recurrent sinus infections    seasonal allergies  . Seasonal allergies     Past Surgical History:  Procedure Laterality Date  . CESAREAN SECTION      Family History  Problem Relation Age of Onset  .  Diabetes Father   . Heart disease Paternal Grandmother     Social History   Social History  . Marital status: Single    Spouse name: N/A  . Number of children: N/A  . Years of education: N/A   Occupational History  . Not on file.   Social History Main Topics  . Smoking status: Never Smoker  . Smokeless tobacco: Never Used  . Alcohol use 0.0 oz/week  . Drug use: No  . Sexual activity: Yes   Other Topics Concern  . Not on file   Social History Narrative  . No narrative on file    No Known Allergies  Outpatient Medications Prior to Visit  Medication Sig Dispense Refill  . ALPRAZolam (XANAX) 0.25 MG tablet Take 1 tablet (0.25 mg total) by mouth 2 (two) times daily as needed for anxiety. 20 tablet 0  . sertraline (ZOLOFT) 50 MG tablet Take 1 tablet (50 mg total) by mouth daily. 30 tablet 3  . misoprostol (CYTOTEC) 200 MCG tablet Place one tablet between your cheek and tongue and allow to dissolve 2-3 hours before IUD insertion (Patient not taking: Reported on 07/06/2016) 1 tablet 0  . mupirocin ointment (BACTROBAN) 2 % Apply 1 application topically 3 (three) times daily. (Patient not taking: Reported on 06/10/2016) 22 g 0  . valACYclovir (VALTREX) 500 MG tablet Take 500 mg by mouth 2 (two) times daily.     No facility-administered medications prior to visit.     Review of Systems  Constitutional: Negative for appetite change, chills, fatigue,  fever, malaise/fatigue and weight loss.  HENT: Negative for ear discharge, ear pain and sore throat.   Eyes: Negative for blurred vision.  Respiratory: Negative for cough, sputum production, shortness of breath and wheezing.   Cardiovascular: Negative for chest pain, palpitations and leg swelling.  Gastrointestinal: Negative for abdominal pain, blood in stool, constipation, diarrhea, heartburn, melena and nausea.  Genitourinary: Negative for dysuria, frequency, hematuria and urgency.  Musculoskeletal: Negative for back pain, joint pain,  myalgias (anx) and neck pain.  Skin: Negative for rash.  Neurological: Negative for dizziness, tingling, sensory change, focal weakness and headaches.  Endo/Heme/Allergies: Negative for environmental allergies and polydipsia. Does not bruise/bleed easily.  Psychiatric/Behavioral: Positive for depression. Negative for decreased concentration and suicidal ideas. The patient is nervous/anxious. The patient does not have insomnia.      Objective  Vitals:   07/06/16 0813  BP: 120/80  Pulse: 70  Weight: 178 lb (80.7 kg)  Height: 5\' 1"  (1.549 m)    Physical Exam  Constitutional: She is well-developed, well-nourished, and in no distress. She is not irritable. No distress.  HENT:  Head: Normocephalic and atraumatic.  Right Ear: External ear normal.  Left Ear: External ear normal.  Nose: Nose normal.  Mouth/Throat: Oropharynx is clear and moist.  Eyes: Conjunctivae and EOM are normal. Pupils are equal, round, and reactive to light. Right eye exhibits no discharge. Left eye exhibits no discharge.  Neck: Normal range of motion. Neck supple. No JVD present. No thyromegaly present.  Cardiovascular: Normal rate, regular rhythm, normal heart sounds and intact distal pulses.  Exam reveals no gallop and no friction rub.   No murmur heard. Pulmonary/Chest: Effort normal and breath sounds normal. She has no wheezes. She has no rales.  Abdominal: Soft. Bowel sounds are normal. She exhibits no mass. There is no tenderness. There is no guarding.  Musculoskeletal: Normal range of motion. She exhibits no edema.  Lymphadenopathy:    She has no cervical adenopathy.  Neurological: She is alert. She has normal reflexes.  Skin: Skin is warm and dry. She is not diaphoretic.  Psychiatric: Mood and affect normal.  Nursing note and vitals reviewed.     Assessment & Plan  Problem List Items Addressed This Visit    None    Visit Diagnoses    Anxiety and depression    -  Primary   Relevant Medications    sertraline (ZOLOFT) 50 MG tablet      Meds ordered this encounter  Medications  . DISCONTD: sertraline (ZOLOFT) 50 MG tablet    Sig: Take 1 tablet (50 mg total) by mouth daily.    Dispense:  30 tablet    Refill:  3    Start one half a day for 10 days  . sertraline (ZOLOFT) 50 MG tablet    Sig: Take 1 tablet (50 mg total) by mouth daily.    Dispense:  90 tablet    Refill:  1    Fill this one/not previous      Dr. Elizabeth Sauereanna [facility]  07/06/16

## 2016-07-22 ENCOUNTER — Telehealth: Payer: Self-pay

## 2016-07-22 ENCOUNTER — Encounter: Payer: Self-pay | Admitting: Obstetrics and Gynecology

## 2016-07-22 ENCOUNTER — Ambulatory Visit (INDEPENDENT_AMBULATORY_CARE_PROVIDER_SITE_OTHER): Payer: BLUE CROSS/BLUE SHIELD | Admitting: Obstetrics and Gynecology

## 2016-07-22 VITALS — BP 112/80 | HR 70 | Ht 60.0 in | Wt 184.0 lb

## 2016-07-22 DIAGNOSIS — Z30430 Encounter for insertion of intrauterine contraceptive device: Secondary | ICD-10-CM | POA: Diagnosis not present

## 2016-07-22 MED ORDER — PARAGARD INTRAUTERINE COPPER IU IUD: 1.0000 | INTRAUTERINE_SYSTEM | Freq: Once | INTRAUTERINE | 0 refills | Status: AC

## 2016-07-22 NOTE — Patient Instructions (Signed)

## 2016-07-22 NOTE — Progress Notes (Signed)
        GYNECOLOGY OFFICE PROCEDURE NOTE  Margaret Wagner is a 26 y.o. G1P1 here for IUD removal and reinsertion. The patient currently has a Mirena IUD placed on 5 years ago, which will be replaced with a paragard IUD today.  No GYN concerns.    IUD Removal and Reinsertion  Patient identified, informed consent performed, consent signed.   Discussed risks of irregular bleeding, cramping, infection, malpositioning or uterine perforation of the IUD which may require further procedures. Time out was performed. Speculum placed in the vagina. The strings of the IUD were grasped and pulled using ring forceps. The IUD was successfully removed in its entirety. The cervix was cleaned with Betadine x 2 and grasped anteriorly with a single tooth tenaculum.  The uterus was sounded to IUD insertion apparatus was used to sound the uterus to 9 cm using a uterine sound.  The IUD was then placed per manufacturer's recommendations. Strings trimmed to 3 cm. Tenaculum was removed, good hemostasis noted. Patient tolerated procedure well.   Patient was given post-procedure instructions.  Patient was also asked to check IUD strings periodically and follow up in 6 weeks for IUD check.   [name], MD, [facility], [facility]

## 2016-07-26 NOTE — Telephone Encounter (Signed)
Pt came for IUD insertion

## 2016-08-30 ENCOUNTER — Ambulatory Visit: Payer: BLUE CROSS/BLUE SHIELD | Admitting: Obstetrics and Gynecology

## 2016-11-17 ENCOUNTER — Ambulatory Visit (INDEPENDENT_AMBULATORY_CARE_PROVIDER_SITE_OTHER): Payer: BLUE CROSS/BLUE SHIELD | Admitting: Podiatry

## 2016-11-17 ENCOUNTER — Ambulatory Visit (INDEPENDENT_AMBULATORY_CARE_PROVIDER_SITE_OTHER): Payer: BLUE CROSS/BLUE SHIELD

## 2016-11-17 DIAGNOSIS — M722 Plantar fascial fibromatosis: Secondary | ICD-10-CM

## 2016-11-17 NOTE — Progress Notes (Signed)
She presents today with a chief complaint of plantar heel pain right greater than the left in the past 2 months. Mornings are particularly bad she's tried stretching in the mornings no help.  Objective: Vital signs are stable she is alert and oriented 3. Pulses are palpable. Normal neuromuscular composition. Deep tendon reflexes are intact orthopedic evaluation was resolved just distal to the angle full range of motion without crepitation. Severe pain on palpation medial calcaneal tubercles bilateral. Radiographs taken today do demonstrate osseous immature individual with plantar and posterior calcaneal heel spurs off tissue increase in density plantar fascia calcaneal insertion site of the right heel greater than that of the left.  Assessment: Plantar fasciitis bilateral.  Plan: Start her on a Medrol Dosepak to be followed by meloxicam. Injected bilateral heels today with Kenalog and local anesthetic. Posterior and bilateral plantar fascia braces and a single night splint. Discussed appropriate shoe gear stretching exercises ice therapy shoe gear modifications. Provided her with a note so that she can wear tennis shoes to work. Follow up with her 1 month.

## 2016-11-18 ENCOUNTER — Encounter: Payer: Self-pay | Admitting: Podiatry

## 2016-11-18 MED ORDER — MELOXICAM 15 MG PO TABS: 15.0000 mg | ORAL_TABLET | Freq: Every day | ORAL | 3 refills | Status: DC

## 2016-11-18 MED ORDER — METHYLPREDNISOLONE 4 MG PO TABS: ORAL_TABLET | ORAL | 0 refills | Status: DC

## 2016-11-18 NOTE — Addendum Note (Signed)
Addended by: Geraldine ContrasVENABLE, Damiana Berrian D on: 11/18/2016 09:34 AM   Modules accepted: Orders

## 2016-12-20 ENCOUNTER — Ambulatory Visit: Payer: BLUE CROSS/BLUE SHIELD | Admitting: Podiatry

## 2016-12-29 ENCOUNTER — Encounter: Payer: Self-pay | Admitting: Podiatry

## 2016-12-29 ENCOUNTER — Ambulatory Visit (INDEPENDENT_AMBULATORY_CARE_PROVIDER_SITE_OTHER): Payer: BLUE CROSS/BLUE SHIELD | Admitting: Podiatry

## 2016-12-29 DIAGNOSIS — M722 Plantar fascial fibromatosis: Secondary | ICD-10-CM | POA: Diagnosis not present

## 2016-12-30 NOTE — Progress Notes (Signed)
She presents today for follow-up of plantar fasciitis bilaterally. States the left foot is almost completely well above her right foot still hurts when I stand on it for a long period of time.  Objective: Vital signs are stable alert and oriented 3. Pulses are palpable. Neurologic sensorium is intact. Deep tendon reflexes are intact. Muscle strength is normal symmetrical. Pain on palpation medial calcaneal tubercle is noted right minimally so on the left foot.  Assessment: Resolving plantar fasciitis left plantar fasciitis painful right.  Plan: I encouraged another injection today to the right heel today see any CONSERVATIVE therapies and I will follow up with her in 1 month

## 2017-01-07 ENCOUNTER — Encounter: Payer: Self-pay | Admitting: Family Medicine

## 2017-01-07 ENCOUNTER — Ambulatory Visit (INDEPENDENT_AMBULATORY_CARE_PROVIDER_SITE_OTHER): Payer: BLUE CROSS/BLUE SHIELD | Admitting: Family Medicine

## 2017-01-07 VITALS — BP 124/80 | HR 72 | Ht 60.0 in | Wt 181.0 lb

## 2017-01-07 DIAGNOSIS — F419 Anxiety disorder, unspecified: Secondary | ICD-10-CM | POA: Diagnosis not present

## 2017-01-07 DIAGNOSIS — F32A Depression, unspecified: Secondary | ICD-10-CM

## 2017-01-07 DIAGNOSIS — Z23 Encounter for immunization: Secondary | ICD-10-CM

## 2017-01-07 DIAGNOSIS — F329 Major depressive disorder, single episode, unspecified: Secondary | ICD-10-CM

## 2017-01-07 DIAGNOSIS — J40 Bronchitis, not specified as acute or chronic: Secondary | ICD-10-CM | POA: Diagnosis not present

## 2017-01-07 DIAGNOSIS — J0100 Acute maxillary sinusitis, unspecified: Secondary | ICD-10-CM

## 2017-01-07 MED ORDER — SERTRALINE HCL 50 MG PO TABS: 50.0000 mg | ORAL_TABLET | Freq: Every day | ORAL | 1 refills | Status: DC

## 2017-01-07 MED ORDER — ALPRAZOLAM 0.25 MG PO TABS: 0.2500 mg | ORAL_TABLET | Freq: Two times a day (BID) | ORAL | 0 refills | Status: DC | PRN

## 2017-01-07 MED ORDER — AMOXICILLIN 500 MG PO CAPS: 500.0000 mg | ORAL_CAPSULE | Freq: Three times a day (TID) | ORAL | 0 refills | Status: DC

## 2017-01-07 MED ORDER — GUAIFENESIN-CODEINE 100-10 MG/5ML PO SYRP: 5.0000 mL | ORAL_SOLUTION | Freq: Three times a day (TID) | ORAL | 0 refills | Status: DC | PRN

## 2017-01-07 NOTE — Progress Notes (Signed)
Name: Margaret Wagner   MRN: [id number]    DOB: [birth year]   Date:01/07/2017       Progress Note  Subjective  Chief Complaint  Chief Complaint  Patient presents with  . Depression    things are "better"- still has "some flares of panic but not nearly as bad as it was"  . Sinusitis    drainage, cough and cong    Depression         This is a chronic problem.  The current episode started more than 1 month ago.   The onset quality is gradual.   The problem has been gradually improving since onset.  Associated symptoms include no decreased concentration, no helplessness, no hopelessness, does not have insomnia, not irritable, no myalgias, no headaches, not sad and no suicidal ideas.  Past treatments include SSRIs - Selective serotonin reuptake inhibitors.  Compliance with treatment is good.  Previous treatment provided moderate relief. Sinusitis  This is a new problem. The current episode started in the past 7 days. The problem has been gradually worsening since onset. There has been no fever. The pain is mild. Associated symptoms include congestion, coughing, sinus pressure and a sore throat. Pertinent negatives include no chills, ear pain, headaches, hoarse voice, neck pain or shortness of breath. Past treatments include acetaminophen and oral decongestants. The treatment provided no relief.  Cough  This is a new problem. The current episode started in the past 7 days. The problem has been waxing and waning. The cough is productive of purulent sputum (green). Associated symptoms include a sore throat. Pertinent negatives include no chest pain, chills, ear pain, fever, headaches, heartburn, myalgias, rash, shortness of breath, weight loss or wheezing. There is no history of environmental allergies.    No problem-specific Assessment & Plan notes found for this encounter.   Past Medical History:  Diagnosis Date  . Recurrent sinus infections    seasonal allergies  . Seasonal allergies      Past Surgical History:  Procedure Laterality Date  . CESAREAN SECTION      Family History  Problem Relation Age of Onset  . Diabetes Father   . Heart disease Paternal Grandmother     Social History   Social History  . Marital status: Single    Spouse name: N/A  . Number of children: N/A  . Years of education: N/A   Occupational History  . Not on file.   Social History Main Topics  . Smoking status: Never Smoker  . Smokeless tobacco: Never Used  . Alcohol use 0.0 oz/week  . Drug use: No  . Sexual activity: Yes   Other Topics Concern  . Not on file   Social History Narrative  . No narrative on file    No Known Allergies  Outpatient Medications Prior to Visit  Medication Sig Dispense Refill  . meloxicam (MOBIC) 15 MG tablet Take 1 tablet (15 mg total) by mouth daily. 30 tablet 3  . ALPRAZolam (XANAX) 0.25 MG tablet Take 1 tablet (0.25 mg total) by mouth 2 (two) times daily as needed for anxiety. 20 tablet 0  . sertraline (ZOLOFT) 50 MG tablet Take 1 tablet (50 mg total) by mouth daily. 90 tablet 1  . mupirocin ointment (BACTROBAN) 2 % Apply 1 application topically 3 (three) times daily. (Patient not taking: Reported on 06/10/2016) 22 g 0  . misoprostol (CYTOTEC) 200 MCG tablet Place one tablet between your cheek and tongue and allow to dissolve 2-3 hours before IUD  insertion (Patient not taking: Reported on 07/06/2016) 1 tablet 0   No facility-administered medications prior to visit.     Review of Systems  Constitutional: Negative for chills, fever, malaise/fatigue and weight loss.  HENT: Positive for congestion, sinus pressure and sore throat. Negative for ear discharge, ear pain and hoarse voice.   Eyes: Negative for blurred vision.  Respiratory: Positive for cough. Negative for sputum production, shortness of breath and wheezing.   Cardiovascular: Negative for chest pain, palpitations and leg swelling.  Gastrointestinal: Negative for abdominal pain, blood in  stool, constipation, diarrhea, heartburn, melena and nausea.  Genitourinary: Negative for dysuria, frequency, hematuria and urgency.  Musculoskeletal: Negative for back pain, joint pain, myalgias and neck pain.  Skin: Negative for rash.  Neurological: Negative for dizziness, tingling, sensory change, focal weakness and headaches.  Endo/Heme/Allergies: Negative for environmental allergies and polydipsia. Does not bruise/bleed easily.  Psychiatric/Behavioral: Positive for depression. Negative for decreased concentration and suicidal ideas. The patient is not nervous/anxious and does not have insomnia.      Objective  Vitals:   01/07/17 0805  BP: 124/80  Pulse: 72  Weight: 181 lb (82.1 kg)  Height: 5' (1.524 m)    Physical Exam  Constitutional: She is well-developed, well-nourished, and in no distress. She is not irritable. No distress.  HENT:  Head: Normocephalic and atraumatic.  Right Ear: External ear normal.  Left Ear: External ear normal.  Nose: Nose normal.  Mouth/Throat: Posterior oropharyngeal erythema present. No oropharyngeal exudate or posterior oropharyngeal edema.  Eyes: Pupils are equal, round, and reactive to light. Conjunctivae and EOM are normal. Right eye exhibits no discharge. Left eye exhibits no discharge.  Neck: Normal range of motion. Neck supple. No JVD present. No thyromegaly present.  Cardiovascular: Normal rate, regular rhythm, normal heart sounds and intact distal pulses.  Exam reveals no gallop and no friction rub.   No murmur heard. Pulmonary/Chest: Effort normal and breath sounds normal. She has no wheezes. She has no rales.  Abdominal: Soft. Bowel sounds are normal. She exhibits no mass. There is no tenderness. There is no guarding.  Musculoskeletal: Normal range of motion. She exhibits no edema.  Lymphadenopathy:       Head (right side): Submandibular adenopathy present.       Head (left side): Submandibular adenopathy present.    She has no  cervical adenopathy.  Neurological: She is alert. She has normal reflexes.  Skin: Skin is warm and dry. She is not diaphoretic.  Psychiatric: Mood and affect normal.  Nursing note and vitals reviewed.     Assessment & Plan  Problem List Items Addressed This Visit      Other   Anxiety and depression - Primary   Relevant Medications   ALPRAZolam (XANAX) 0.25 MG tablet   sertraline (ZOLOFT) 50 MG tablet    Other Visit Diagnoses    Acute maxillary sinusitis, recurrence not specified       Relevant Medications   amoxicillin (AMOXIL) 500 MG capsule   guaiFENesin-codeine (ROBITUSSIN AC) 100-10 MG/5ML syrup   Bronchitis       Relevant Medications   amoxicillin (AMOXIL) 500 MG capsule   guaiFENesin-codeine (ROBITUSSIN AC) 100-10 MG/5ML syrup   Influenza vaccine needed       Relevant Orders   Flu Vaccine QUAD 36+ mos IM      Meds ordered this encounter  Medications  . ALPRAZolam (XANAX) 0.25 MG tablet    Sig: Take 1 tablet (0.25 mg total) by mouth 2 (two) times  daily as needed for anxiety.    Dispense:  20 tablet    Refill:  0  . sertraline (ZOLOFT) 50 MG tablet    Sig: Take 1 tablet (50 mg total) by mouth daily.    Dispense:  90 tablet    Refill:  1    Fill this one/not previous  . amoxicillin (AMOXIL) 500 MG capsule    Sig: Take 1 capsule (500 mg total) by mouth 3 (three) times daily.    Dispense:  30 capsule    Refill:  0  . guaiFENesin-codeine (ROBITUSSIN AC) 100-10 MG/5ML syrup    Sig: Take 5 mLs by mouth 3 (three) times daily as needed for cough.    Dispense:  150 mL    Refill:  0      Dr. Elizabeth [facility]  01/07/17

## 2017-01-26 ENCOUNTER — Ambulatory Visit: Payer: BLUE CROSS/BLUE SHIELD | Admitting: Podiatry

## 2017-05-17 ENCOUNTER — Ambulatory Visit: Payer: BLUE CROSS/BLUE SHIELD | Admitting: Obstetrics and Gynecology

## 2017-05-17 ENCOUNTER — Encounter: Payer: Self-pay | Admitting: Obstetrics and Gynecology

## 2017-05-17 ENCOUNTER — Ambulatory Visit (INDEPENDENT_AMBULATORY_CARE_PROVIDER_SITE_OTHER): Payer: BLUE CROSS/BLUE SHIELD | Admitting: Obstetrics and Gynecology

## 2017-05-17 VITALS — BP 112/76 | HR 110 | Ht 60.0 in | Wt 187.0 lb

## 2017-05-17 DIAGNOSIS — Z30431 Encounter for routine checking of intrauterine contraceptive device: Secondary | ICD-10-CM

## 2017-05-17 NOTE — Progress Notes (Signed)
Obstetrics & Gynecology Office Visit   Chief Complaint:  Chief Complaint  Patient presents with  . IUD check    Tampon got stuck on IUD strings, feels strings outside vagina    History of Present Illness: 27 y.o. patient presenting for follow up of Paragard IUD placement 6+ weeks ago.  She denies any complications since her IUD placement.  Still having some occasional spotting.  is able to feel strings.  States string are hanging out like tampon strings  Review of Systems: Review of Systems  Constitutional: Negative for chills and fever.  Gastrointestinal: Negative for abdominal pain, constipation, diarrhea, nausea and vomiting.  Genitourinary: Negative for dysuria.    Past Medical History:  Past Medical History:  Diagnosis Date  . Recurrent sinus infections    seasonal allergies  . Seasonal allergies     Past Surgical History:  Past Surgical History:  Procedure Laterality Date  . CESAREAN SECTION      Gynecologic History: No LMP recorded. Patient is not currently having periods (Reason: IUD).  Obstetric History: G1P1  Family History:  Family History  Problem Relation Age of Onset  . Diabetes Father   . Heart disease Paternal Grandmother     Social History:  Social History   Socioeconomic History  . Marital status: Single    Spouse name: Not on file  . Number of children: Not on file  . Years of education: Not on file  . Highest education level: Not on file  Social Needs  . Financial resource strain: Not on file  . Food insecurity - worry: Not on file  . Food insecurity - inability: Not on file  . Transportation needs - medical: Not on file  . Transportation needs - non-medical: Not on file  Occupational History  . Not on file  Tobacco Use  . Smoking status: Never Smoker  . Smokeless tobacco: Never Used  Substance and Sexual Activity  . Alcohol use: Yes    Alcohol/week: 0.0 oz  . Drug use: No  . Sexual activity: Yes  Other Topics Concern  .  Not on file  Social History Narrative  . Not on file    Allergies:  No Known Allergies  Medications: Prior to Admission medications   Medication Sig Start Date End Date Taking? Authorizing Provider  ALPRAZolam (XANAX) 0.25 MG tablet Take 1 tablet (0.25 mg total) by mouth 2 (two) times daily as needed for anxiety. 01/07/17   Duanne Limerick, MD  amoxicillin (AMOXIL) 500 MG capsule Take 1 capsule (500 mg total) by mouth 3 (three) times daily. 01/07/17   Duanne Limerick, MD  guaiFENesin-codeine (ROBITUSSIN AC) 100-10 MG/5ML syrup Take 5 mLs by mouth 3 (three) times daily as needed for cough. 01/07/17   Duanne Limerick, MD  meloxicam (MOBIC) 15 MG tablet Take 1 tablet (15 mg total) by mouth daily. 11/18/16   Hyatt, Max T, DPM  mupirocin ointment (BACTROBAN) 2 % Apply 1 application topically 3 (three) times daily. Patient not taking: Reported on 06/10/2016 06/02/16   Hassan Rowan, MD  sertraline (ZOLOFT) 50 MG tablet Take 1 tablet (50 mg total) by mouth daily. 01/07/17   Duanne Limerick, MD    Physical Exam Vitals:  Vitals:   05/17/17 1423  BP: 112/76  Pulse: (!) 110   No LMP recorded. Patient is not currently having periods (Reason: IUD).  General: NAD HEENT: normocephalic, anicteric Pulmonary: No increased work of breathing Cardiovascular: RRR, distal pulses 2+ Genitourinary:  External: Normal external female genitalia.  Normal urethral meatus, normal  Bartholin's and Skene's glands.  No strings visualized   Vagina: Normal vaginal mucosa, no evidence of prolapse.    Cervix: Grossly normal in appearance, no bleeding, IUD strings visualized cut to 3cm  Uterus: Non-enlarged, mobile, normal contour.  No CMT  Adnexa: ovaries non-enlarged, no adnexal masses  Rectal: deferred  Lymphatic: no evidence of inguinal lymphadenopathy Extremities: no edema, erythema, or tenderness Neurologic: Grossly intact Psychiatric: mood appropriate, affect full  Female chaperone present for pelvic and breast   portions of the physical exam  Assessment: 27 y.o. G1P1 IUD string check  Plan: Problem List Items Addressed This Visit    None    Visit Diagnoses    IUD check up    -  Primary       1.  The patient was given instructions to check her IUD strings monthly and call with any problems or concerns.  She should call for fevers, chills, abnormal vaginal discharge, pelvic pain, or other complaints. 2.   IUDs while effective at preventing pregnancy do not prevent transmission of sexually transmitted diseases and use of barrier methods for this purpose was discussed.  Low overall incidence of failure with 99.7% efficacy rate in typical use.  The patient has not contraindication to IUD placement. 3.  A total of 15 minutes were spent in face-to-face contact with the patient during this encounter with over half of that time devoted to counseling and coordination of care. 4) She will return for a annual exam in 1 year.  All questions answered.

## 2017-06-29 ENCOUNTER — Encounter: Payer: Self-pay | Admitting: Podiatry

## 2017-06-29 ENCOUNTER — Ambulatory Visit: Payer: BLUE CROSS/BLUE SHIELD | Admitting: Podiatry

## 2017-06-29 DIAGNOSIS — M722 Plantar fascial fibromatosis: Secondary | ICD-10-CM

## 2017-06-29 MED ORDER — MELOXICAM 15 MG PO TABS: 15.0000 mg | ORAL_TABLET | Freq: Every day | ORAL | 3 refills | Status: DC

## 2017-06-29 NOTE — Progress Notes (Signed)
She presents today for follow-up of plantar fasciitis.  States that she has had a flareup for the past month or so has been doing great since September recently just flared up again.  States that the left foot seems to be worse than the right foot mornings are particularly bad.  She no longer has any meloxicam or ibuprofen to take.  Objective: Vital signs are stable alert and oriented x3.  Pulses are palpable.  He has pain on palpation medial calcaneal tubercles bilaterally.  Assessment:.  Plantar fasciitis bilateral.  Plan: Discussed etiology pathology conservative versus surgical therapies.  At this point I injected 20 mg Kenalog 5 mg Marcaine to the medial aspect of the bilateral heels today.  She tolerated procedure well without complications.  Also I want to refill her meloxicam and I will follow-up with her in 1 month if necessary.  May need to remake her orthotics.

## 2017-07-07 ENCOUNTER — Ambulatory Visit: Payer: BLUE CROSS/BLUE SHIELD | Admitting: Family Medicine

## 2017-09-08 ENCOUNTER — Encounter: Payer: Self-pay | Admitting: Family Medicine

## 2017-09-15 ENCOUNTER — Encounter: Payer: BLUE CROSS/BLUE SHIELD | Admitting: Family Medicine

## 2017-09-21 ENCOUNTER — Ambulatory Visit (INDEPENDENT_AMBULATORY_CARE_PROVIDER_SITE_OTHER): Payer: BLUE CROSS/BLUE SHIELD | Admitting: Family Medicine

## 2017-09-21 ENCOUNTER — Encounter: Payer: Self-pay | Admitting: Family Medicine

## 2017-09-21 VITALS — BP 120/80 | HR 72 | Ht 60.0 in | Wt 185.0 lb

## 2017-09-21 DIAGNOSIS — R5383 Other fatigue: Secondary | ICD-10-CM

## 2017-09-21 DIAGNOSIS — F419 Anxiety disorder, unspecified: Secondary | ICD-10-CM

## 2017-09-21 DIAGNOSIS — Z0001 Encounter for general adult medical examination with abnormal findings: Secondary | ICD-10-CM | POA: Diagnosis not present

## 2017-09-21 DIAGNOSIS — F329 Major depressive disorder, single episode, unspecified: Secondary | ICD-10-CM

## 2017-09-21 DIAGNOSIS — S80811A Abrasion, right lower leg, initial encounter: Secondary | ICD-10-CM | POA: Diagnosis not present

## 2017-09-21 DIAGNOSIS — R635 Abnormal weight gain: Secondary | ICD-10-CM

## 2017-09-21 DIAGNOSIS — E785 Hyperlipidemia, unspecified: Secondary | ICD-10-CM | POA: Diagnosis not present

## 2017-09-21 DIAGNOSIS — L659 Nonscarring hair loss, unspecified: Secondary | ICD-10-CM

## 2017-09-21 DIAGNOSIS — F32A Depression, unspecified: Secondary | ICD-10-CM

## 2017-09-21 DIAGNOSIS — Z0000 Encounter for general adult medical examination without abnormal findings: Secondary | ICD-10-CM

## 2017-09-21 MED ORDER — MUPIROCIN 2 % EX OINT: 1.0000 "application " | TOPICAL_OINTMENT | Freq: Two times a day (BID) | CUTANEOUS | 0 refills | Status: DC

## 2017-09-21 MED ORDER — SERTRALINE HCL 100 MG PO TABS: 100.0000 mg | ORAL_TABLET | Freq: Every day | ORAL | 3 refills | Status: DC

## 2017-09-21 MED ORDER — ALPRAZOLAM 0.25 MG PO TABS: 0.2500 mg | ORAL_TABLET | Freq: Two times a day (BID) | ORAL | 0 refills | Status: DC | PRN

## 2017-09-21 NOTE — Progress Notes (Signed)
Name: Margaret Wagner   MRN: [id number]    DOB: 1990/12/22   Date:09/21/2017       Progress Note  Subjective  Chief Complaint  Chief Complaint  Patient presents with  . Annual Exam    no pap needed/ has OBGYN- renal, lipid and thyroid for hair loss/ trouble losing weight  . Depression    Patient is a 27 year old female who presents for a comprehensive physical exam. The patient reports the following problems: inability to lose weight and fatigue. Health maintenance has been reviewed and patient is up to date.  Depression         This is a chronic problem.  The current episode started more than 1 year ago.   The onset quality is gradual. The problem is unchanged.  Associated symptoms include fatigue, irritable and decreased interest.  Associated symptoms include no decreased concentration, no helplessness, no hopelessness, does not have insomnia, no restlessness, no appetite change, no body aches, no myalgias, no headaches, no indigestion, not sad and no suicidal ideas.     Exacerbated by: everythinh.  Past treatments include SSRIs - Selective serotonin reuptake inhibitors.  Compliance with treatment is good.  Previous treatment provided mild relief.  Past medical history includes thyroid problem.   Thyroid Problem  Presents for initial visit. Symptoms include cold intolerance, depressed mood, diarrhea, fatigue, hair loss, palpitations and weight gain. Patient reports no anxiety, constipation, diaphoresis, dry skin, heat intolerance, hoarse voice, leg swelling, menstrual problem, nail problem, tremors, visual change or weight loss. The symptoms have been worsening.    No problem-specific Assessment & Plan notes found for this encounter.   Past Medical History:  Diagnosis Date  . Recurrent sinus infections    seasonal allergies  . Seasonal allergies     Past Surgical History:  Procedure Laterality Date  . CESAREAN SECTION      Family History  Problem Relation Age of Onset  .  Diabetes Father   . Heart disease Paternal Grandmother     Social History   Socioeconomic History  . Marital status: Single    Spouse name: Not on file  . Number of children: Not on file  . Years of education: Not on file  . Highest education level: Not on file  Occupational History  . Not on file  Social Needs  . Financial resource strain: Not on file  . Food insecurity:    Worry: Not on file    Inability: Not on file  . Transportation needs:    Medical: Not on file    Non-medical: Not on file  Tobacco Use  . Smoking status: Never Smoker  . Smokeless tobacco: Never Used  Substance and Sexual Activity  . Alcohol use: Yes    Alcohol/week: 0.0 oz  . Drug use: No  . Sexual activity: Yes  Lifestyle  . Physical activity:    Days per week: Not on file    Minutes per session: Not on file  . Stress: Not on file  Relationships  . Social connections:    Talks on phone: Not on file    Gets together: Not on file    Attends religious service: Not on file    Active member of club or organization: Not on file    Attends meetings of clubs or organizations: Not on file    Relationship status: Not on file  . Intimate partner violence:    Fear of current or ex partner: Not on file  Emotionally abused: Not on file    Physically abused: Not on file    Forced sexual activity: Not on file  Other Topics Concern  . Not on file  Social History Narrative  . Not on file    No Known Allergies  Outpatient Medications Prior to Visit  Medication Sig Dispense Refill  . ALPRAZolam (XANAX) 0.25 MG tablet Take 1 tablet (0.25 mg total) by mouth 2 (two) times daily as needed for anxiety. 20 tablet 0  . sertraline (ZOLOFT) 50 MG tablet Take 1 tablet (50 mg total) by mouth daily. 90 tablet 1  . meloxicam (MOBIC) 15 MG tablet Take 1 tablet (15 mg total) by mouth daily. 30 tablet 3  . mupirocin ointment (BACTROBAN) 2 % Apply 1 application topically 3 (three) times daily. (Patient not taking:  Reported on 06/10/2016) 22 g 0   No facility-administered medications prior to visit.     Review of Systems  Constitutional: Positive for fatigue and weight gain. Negative for appetite change, chills, diaphoresis, fever, malaise/fatigue and weight loss.  HENT: Negative for ear discharge, ear pain, hoarse voice and sore throat.   Eyes: Negative for blurred vision.  Respiratory: Negative for cough, sputum production, shortness of breath and wheezing.   Cardiovascular: Positive for palpitations. Negative for chest pain, orthopnea, claudication and leg swelling.  Gastrointestinal: Positive for diarrhea. Negative for abdominal pain, blood in stool, constipation, heartburn, melena and nausea.  Genitourinary: Negative for dysuria, frequency, hematuria, menstrual problem and urgency.  Musculoskeletal: Positive for falls. Negative for back pain, joint pain, myalgias and neck pain.  Skin: Negative for rash.  Neurological: Negative for dizziness, tingling, tremors, sensory change, focal weakness and headaches.  Endo/Heme/Allergies: Positive for cold intolerance. Negative for environmental allergies, heat intolerance and polydipsia. Does not bruise/bleed easily.  Psychiatric/Behavioral: Positive for depression. Negative for decreased concentration and suicidal ideas. The patient is not nervous/anxious and does not have insomnia.      Objective  Vitals:   09/21/17 1107  BP: 120/80  Pulse: 72  Weight: 185 lb (83.9 kg)  Height: 5' (1.524 m)    Physical Exam  Constitutional: She is oriented to person, place, and time. Vital signs are normal. She appears well-developed and well-nourished. She is irritable. No distress.  HENT:  Head: Normocephalic and atraumatic.  Right Ear: Hearing, tympanic membrane, external ear and ear canal normal.  Left Ear: Hearing, tympanic membrane, external ear and ear canal normal.  Nose: Nose normal. No mucosal edema.  Mouth/Throat: Uvula is midline and oropharynx is  clear and moist. Normal dentition. No oropharyngeal exudate, posterior oropharyngeal edema or posterior oropharyngeal erythema.  Eyes: Pupils are equal, round, and reactive to light. Conjunctivae, EOM and lids are normal. Right eye exhibits no discharge. Left eye exhibits no discharge.  Fundoscopic exam:      The right eye shows no arteriolar narrowing, no AV nicking, no exudate, no hemorrhage and no papilledema. The right eye shows no red reflex and no venous pulsations.       The left eye shows no arteriolar narrowing, no AV nicking, no exudate, no hemorrhage and no papilledema. The left eye shows no red reflex and no venous pulsations.  Neck: Trachea normal and normal range of motion. Neck supple. Normal carotid pulses, no hepatojugular reflux and no JVD present. Carotid bruit is not present. No edema and no erythema present. No thyromegaly present.  Cardiovascular: Normal rate, regular rhythm, S1 normal, S2 normal, normal heart sounds, intact distal pulses and normal pulses. Exam reveals  no gallop, no S3, no S4 and no friction rub.  No murmur heard.  No systolic murmur is present.  No diastolic murmur is present. Pulses:      Carotid pulses are 2+ on the right side, and 2+ on the left side.      Radial pulses are 2+ on the right side, and 2+ on the left side.       Femoral pulses are 2+ on the right side, and 2+ on the left side.      Popliteal pulses are 2+ on the right side, and 2+ on the left side.       Dorsalis pedis pulses are 2+ on the right side, and 2+ on the left side.       Posterior tibial pulses are 2+ on the right side, and 2+ on the left side.  Pulmonary/Chest: Effort normal and breath sounds normal. No stridor. She has no decreased breath sounds. She has no wheezes. She has no rhonchi. She has no rales. Right breast exhibits no inverted nipple, no mass, no nipple discharge, no skin change and no tenderness. Left breast exhibits no inverted nipple, no mass, no nipple discharge, no  skin change and no tenderness. No breast swelling, tenderness, discharge or bleeding. Breasts are symmetrical.  Abdominal: Soft. Normal aorta and bowel sounds are normal. She exhibits no mass. There is no hepatosplenomegaly. There is no tenderness. There is no guarding.  Musculoskeletal: Normal range of motion. She exhibits no edema.       Cervical back: Normal.       Thoracic back: Normal.       Lumbar back: Normal.  Lymphadenopathy:    She has no cervical adenopathy.    She has no axillary adenopathy.  Neurological: She is alert and oriented to person, place, and time. She has normal strength and normal reflexes. No cranial nerve deficit or sensory deficit.  Reflex Scores:      Tricep reflexes are 2+ on the right side and 2+ on the left side.      Bicep reflexes are 2+ on the right side and 2+ on the left side.      Brachioradialis reflexes are 2+ on the right side and 2+ on the left side.      Patellar reflexes are 2+ on the right side and 2+ on the left side.      Achilles reflexes are 2+ on the right side and 2+ on the left side. Skin: Skin is warm and dry. Abrasion and bruising noted. She is not diaphoretic.     Abrasion right shin 2x6 inches  Psychiatric: Her behavior is normal. She exhibits a depressed mood.  Nursing note and vitals reviewed.     Assessment & Plan  Problem List Items Addressed This Visit      Other   Anxiety and depression   Relevant Medications   ALPRAZolam (XANAX) 0.25 MG tablet   sertraline (ZOLOFT) 100 MG tablet    Other Visit Diagnoses    Annual physical exam    -  Primary   Relevant Orders   Lipid Panel With LDL/HDL Ratio   Renal function panel   Hair loss       check tsh   Relevant Orders   Thyroid Panel With TSH   Weight gain       check tsh   Relevant Orders   Thyroid Panel With TSH   Fatigue, unspecified type       check tsh  Relevant Orders   Renal function panel   Thyroid Panel With TSH   CBC   Dyslipidemia       check  lipids   Relevant Orders   Lipid Panel With LDL/HDL Ratio   Abrasion, right lower leg, initial encounter       cleaned and dressed leg   Relevant Medications   mupirocin ointment (BACTROBAN) 2 %      Meds ordered this encounter  Medications  . ALPRAZolam (XANAX) 0.25 MG tablet    Sig: Take 1 tablet (0.25 mg total) by mouth 2 (two) times daily as needed for anxiety.    Dispense:  20 tablet    Refill:  0  . sertraline (ZOLOFT) 100 MG tablet    Sig: Take 1 tablet (100 mg total) by mouth daily.    Dispense:  30 tablet    Refill:  3  . mupirocin ointment (BACTROBAN) 2 %    Sig: Apply 1 application topically 2 (two) times daily.    Dispense:  22 g    Refill:  0      Dr. [facility]  09/21/17

## 2017-09-30 LAB — RENAL FUNCTION PANEL
Albumin: 4.5 g/dL (ref 3.5–5.5)
BUN / CREAT RATIO: 18 (ref 9–23)
BUN: 15 mg/dL (ref 6–20)
CALCIUM: 10 mg/dL (ref 8.7–10.2)
CO2: 21 mmol/L (ref 20–29)
Chloride: 104 mmol/L (ref 96–106)
Creatinine, Ser: 0.84 mg/dL (ref 0.57–1.00)
GFR calc Af Amer: 110 mL/min/{1.73_m2} (ref >59)
GFR, EST NON AFRICAN AMERICAN: 96 mL/min/{1.73_m2} (ref >59)
GLUCOSE: 65 mg/dL (ref 65–99)
PHOSPHORUS: 3.5 mg/dL (ref 2.5–4.5)
POTASSIUM: 4.1 mmol/L (ref 3.5–5.2)
SODIUM: 143 mmol/L (ref 134–144)

## 2017-09-30 LAB — CBC
HEMATOCRIT: 42.2 % (ref 34.0–46.6)
HEMOGLOBIN: 14.4 g/dL (ref 11.1–15.9)
MCH: 30.1 pg (ref 26.6–33.0)
MCHC: 34.1 g/dL (ref 31.5–35.7)
MCV: 88 fL (ref 79–97)
Platelets: 330 10*3/uL (ref 150–450)
RBC: 4.78 x10E6/uL (ref 3.77–5.28)
RDW: 13.8 % (ref 12.3–15.4)
WBC: 6.4 10*3/uL (ref 3.4–10.8)

## 2017-09-30 LAB — LIPID PANEL WITH LDL/HDL RATIO
Cholesterol, Total: 146 mg/dL (ref 100–199)
HDL: 46 mg/dL (ref >39)
LDL CALC: 80 mg/dL (ref 0–99)
LDL/HDL RATIO: 1.7 ratio (ref 0.0–3.2)
Triglycerides: 98 mg/dL (ref 0–149)
VLDL Cholesterol Cal: 20 mg/dL (ref 5–40)

## 2017-09-30 LAB — THYROID PANEL WITH TSH
Free Thyroxine Index: 1.4 (ref 1.2–4.9)
T3 Uptake Ratio: 22 % — ABNORMAL LOW (ref 24–39)
T4 TOTAL: 6.4 ug/dL (ref 4.5–12.0)
TSH: 3.17 u[IU]/mL (ref 0.450–4.500)

## 2017-11-09 ENCOUNTER — Ambulatory Visit: Payer: BLUE CROSS/BLUE SHIELD | Admitting: Podiatry

## 2017-11-09 ENCOUNTER — Encounter: Payer: Self-pay | Admitting: Podiatry

## 2017-11-09 DIAGNOSIS — M722 Plantar fascial fibromatosis: Secondary | ICD-10-CM | POA: Diagnosis not present

## 2017-11-09 MED ORDER — CELECOXIB 200 MG PO CAPS: 200.0000 mg | ORAL_CAPSULE | Freq: Every day | ORAL | 3 refills | Status: DC

## 2017-11-09 NOTE — Progress Notes (Signed)
She presents today for follow-up of her plantar fasciitis states that she was doing great for quite some time and that is starting to bother her again over the last couple of months.  She is about to go on [place] like to make sure that her feet are bothering her.  States that the meloxicam really does not seem to be doing very much.  Has purchased new shoes which seems to help the most.  Objective: Vital signs are stable she is alert and oriented x3.  Pulses are palpable.  Has pain on palpation medial calcaneal tubercles bilateral heels.  Assessment: Plantar fasciitis bilateral.  Plan: At this point injected bilateral heels today with 20 mg Kenalog 5 mg Marcaine point maximal tenderness bilateral.  Changed her meloxicam to Celebrex 200 mg 1 p.o. daily and also made an appointment for her to see Raiford NobleRick for a set of orthotics

## 2017-11-28 ENCOUNTER — Telehealth: Payer: Self-pay

## 2017-11-28 ENCOUNTER — Other Ambulatory Visit: Payer: Self-pay

## 2017-11-28 MED ORDER — VALACYCLOVIR HCL 1 G PO TABS: 1000.0000 mg | ORAL_TABLET | Freq: Two times a day (BID) | ORAL | 0 refills | Status: DC

## 2017-11-28 NOTE — Telephone Encounter (Signed)
Called in with "flare up HSV"- sent in valacyclovir 1 Gm BID x 10 days to [facility] [place]

## 2017-11-30 ENCOUNTER — Ambulatory Visit (INDEPENDENT_AMBULATORY_CARE_PROVIDER_SITE_OTHER): Payer: BLUE CROSS/BLUE SHIELD | Admitting: Orthotics

## 2017-11-30 DIAGNOSIS — M722 Plantar fascial fibromatosis: Secondary | ICD-10-CM

## 2017-11-30 NOTE — Progress Notes (Signed)

## 2017-12-21 ENCOUNTER — Ambulatory Visit: Payer: BLUE CROSS/BLUE SHIELD | Admitting: Orthotics

## 2017-12-21 DIAGNOSIS — M722 Plantar fascial fibromatosis: Secondary | ICD-10-CM

## 2017-12-21 NOTE — Progress Notes (Signed)
Patient came in today to pick up custom made foot orthotics.  The goals were accomplished and the patient reported no dissatisfaction with said orthotics.  Patient was advised of breakin period and how to report any issues. 

## 2018-01-02 ENCOUNTER — Other Ambulatory Visit: Payer: Self-pay

## 2018-01-02 MED ORDER — VALACYCLOVIR HCL 1 G PO TABS: 1000.0000 mg | ORAL_TABLET | Freq: Two times a day (BID) | ORAL | 0 refills | Status: DC

## 2018-01-25 ENCOUNTER — Ambulatory Visit
Admission: EM | Admit: 2018-01-25 | Discharge: 2018-01-25 | Disposition: A | Discharge status: Home / Self Care | Payer: BLUE CROSS/BLUE SHIELD | Attending: Family Medicine | Admitting: Family Medicine

## 2018-01-25 ENCOUNTER — Other Ambulatory Visit: Payer: Self-pay

## 2018-01-25 DIAGNOSIS — R509 Fever, unspecified: Secondary | ICD-10-CM | POA: Diagnosis not present

## 2018-01-25 DIAGNOSIS — R05 Cough: Secondary | ICD-10-CM

## 2018-01-25 DIAGNOSIS — M791 Myalgia, unspecified site: Secondary | ICD-10-CM

## 2018-01-25 DIAGNOSIS — J111 Influenza due to unidentified influenza virus with other respiratory manifestations: Secondary | ICD-10-CM

## 2018-01-25 DIAGNOSIS — R0981 Nasal congestion: Secondary | ICD-10-CM

## 2018-01-25 DIAGNOSIS — R69 Illness, unspecified: Principal | ICD-10-CM

## 2018-01-25 LAB — RAPID INFLUENZA A&B ANTIGENS (ARMC ONLY): INFLUENZA B (ARMC): NEGATIVE

## 2018-01-25 LAB — RAPID INFLUENZA A&B ANTIGENS: Influenza A (ARMC): NEGATIVE

## 2018-01-25 MED ORDER — OSELTAMIVIR PHOSPHATE 75 MG PO CAPS: 75.0000 mg | ORAL_CAPSULE | Freq: Two times a day (BID) | ORAL | 0 refills | Status: DC

## 2018-01-25 NOTE — ED Provider Notes (Signed)
[facility] ____________________________________________  Time seen: Approximately 8:50 AM  I have reviewed the triage vital signs and the nursing notes.   HISTORY  Chief Complaint Fever   HPI Margaret Wagner is a 27 y.o. female presenting for evaluation of fever, chills, body aches some congestion and cough that started with quick onset last night.  Reports around 6 PM she started not feel well and had a low-grade temperature, patient reports that within a few hours she rechecked it again and it continued to increase up to T-max 103.  Reports has been taken Tylenol and ibuprofen since, last Tylenol at 2 AM.  States minimally scratchy throat, denies actual sore throat.  Mild cough.  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  Denies chest pain, shortness breath, abdominal pain, dysuria, rash or other complaints.  Denies other aggravating alleviating factors.  Reports otherwise feels well denies other complaints. Denies recent sickness. Denies recent antibiotic use.   Duanne Limerick, MD: PCP No LMP recorded. (Menstrual status: IUD).Denies pregnancy.    Past Medical History:  Diagnosis Date  . Recurrent sinus infections    seasonal allergies  . Seasonal allergies     Patient Active Problem List   Diagnosis Date Noted  . Anxiety and depression 01/07/2017    Past Surgical History:  Procedure Laterality Date  . CESAREAN SECTION       No current facility-administered medications for this encounter.   Current Outpatient Medications:  .  ALPRAZolam (XANAX) 0.25 MG tablet, Take 1 tablet (0.25 mg total) by mouth 2 (two) times daily as needed for anxiety., Disp: 20 tablet, Rfl: 0 .  celecoxib (CELEBREX) 200 MG capsule, Take 1 capsule (200 mg total) by mouth daily., Disp: 30 capsule, Rfl: 3 .  sertraline (ZOLOFT) 100 MG tablet, Take 1 tablet (100 mg total) by mouth daily., Disp: 30 tablet, Rfl: 3 .   oseltamivir (TAMIFLU) 75 MG capsule, Take 1 capsule (75 mg total) by mouth every 12 (twelve) hours., Disp: 10 capsule, Rfl: 0  Allergies Patient has no known allergies.  Family History  Problem Relation Age of Onset  . Diabetes Father   . Heart disease Paternal Grandmother     Social History Social History   Tobacco Use  . Smoking status: Never Smoker  . Smokeless tobacco: Never Used  Substance Use Topics  . Alcohol use: Yes    Alcohol/week: 0.0 standard drinks    Comment: occasionally  . Drug use: No    Review of Systems Constitutional: positive fever ENT: positive sore throat. Cardiovascular: Denies chest pain. Respiratory: Denies shortness of breath. Gastrointestinal: No abdominal pain.   Genitourinary: Negative for dysuria. Skin: Negative for rash.   ____________________________________________   PHYSICAL EXAM:  VITAL SIGNS: ED Triage Vitals  Enc Vitals Group     BP 01/25/18 0836 (!) 133/100     Pulse Rate 01/25/18 0836 93     Resp 01/25/18 0836 18     Temp 01/25/18 0836 98.5 F (36.9 C)     Temp Source 01/25/18 0836 Oral     SpO2 01/25/18 0836 99 %     Weight 01/25/18 0834 180 lb (81.6 kg)     Height 01/25/18 0834 5' (1.524 m)     Head Circumference --      Peak Flow --      Pain Score 01/25/18 0834 3     Pain Loc --  Pain Edu? --      Excl. in GC? --    Vitals:   01/25/18 0834 01/25/18 0836 01/25/18 0852  BP:  (!) 133/100 128/90  Pulse:  93   Resp:  18   Temp:  98.5 F (36.9 C)   TempSrc:  Oral   SpO2:  99%   Weight: 180 lb (81.6 kg)    Height: 5' (1.524 m)       Constitutional: Alert and oriented. Well appearing and in no acute distress. Eyes: Conjunctivae are normal.  Head: Atraumatic. No sinus tenderness to palpation. No swelling. No erythema.  Ears: no erythema, normal TMs bilaterally.   Nose:mild nasal congestion   Mouth/Throat: Mucous membranes are moist. No pharyngeal erythema. No tonsillar swelling or exudate.  Neck: No  stridor.  No cervical spine tenderness to palpation. Hematological/Lymphatic/Immunilogical: No cervical lymphadenopathy. Cardiovascular: Normal rate, regular rhythm. Grossly normal heart sounds.  Good peripheral circulation. Respiratory: Normal respiratory effort.  No retractions. No wheezes, rales or rhonchi. Good air movement.  Gastrointestinal: No CVA tenderness. Musculoskeletal: Ambulatory with steady gait. No cervical, thoracic or lumbar tenderness to palpation. Neurologic:  Normal speech and language. No gait instability. Skin:  Skin appears warm, dry and intact. No rash noted. Psychiatric: Mood and affect are normal. Speech and behavior are normal.   ___________________________________________   LABS (all labs ordered are listed, but only abnormal results are displayed)  Labs Reviewed  RAPID INFLUENZA A&B ANTIGENS ([facility] ONLY)   ____________________________________________  PROCEDURES Procedures   INITIAL IMPRESSION / ASSESSMENT AND PLAN / ED COURSE  Pertinent labs & imaging results that were available during my care of the patient were reviewed by me and considered in my medical decision making (see chart for details).  Overall well-appearing patient.  No acute distress.  Suspect viral illness such as influenza.  Offered strep swab, patient declined.  Discussed treatment with Tamiflu.  Will treat with Tamiflu.  Encourage rest, fluids, Tylenol and ibuprofen.Discussed indication, risks and benefits of medications with patient.  Discussed follow up with Primary care physician this week. Discussed follow up and return parameters including no resolution or any worsening concerns. Patient verbalized understanding and agreed to plan.   ____________________________________________   FINAL CLINICAL IMPRESSION(S) / ED DIAGNOSES  Final diagnoses:  Influenza-like illness     ED Discharge Orders         Ordered    oseltamivir (TAMIFLU) 75 MG capsule  Every 12 hours     01/25/18  0859           Note: This dictation was prepared with Dragon dictation along with smaller phrase technology. Any transcriptional errors that result from this process are unintentional.         Renford Dills, NP 01/25/18 [phone]

## 2018-01-25 NOTE — Discharge Instructions (Signed)
Take medication as prescribed. Rest. Drink plenty of fluids.  Over the counter Tylenol and ibuprofen as needed. ° °Follow up with your primary care physician this week as needed. Return to Urgent care for new or worsening concerns.  ° °

## 2018-01-25 NOTE — ED Triage Notes (Signed)
Patient complains of fever, body aches, congestion, runny nose that started around 6pm last night

## 2018-03-13 ENCOUNTER — Encounter: Payer: Self-pay | Admitting: Family Medicine

## 2018-03-13 ENCOUNTER — Ambulatory Visit: Payer: BLUE CROSS/BLUE SHIELD | Admitting: Family Medicine

## 2018-03-13 VITALS — BP 108/70 | HR 80 | Ht 60.0 in | Wt 180.0 lb

## 2018-03-13 DIAGNOSIS — J0100 Acute maxillary sinusitis, unspecified: Secondary | ICD-10-CM | POA: Diagnosis not present

## 2018-03-13 DIAGNOSIS — R05 Cough: Secondary | ICD-10-CM

## 2018-03-13 DIAGNOSIS — R059 Cough, unspecified: Secondary | ICD-10-CM

## 2018-03-13 MED ORDER — AMOXICILLIN 500 MG PO CAPS: 500.0000 mg | ORAL_CAPSULE | Freq: Three times a day (TID) | ORAL | 0 refills | Status: DC

## 2018-03-13 MED ORDER — GUAIFENESIN-CODEINE 100-10 MG/5ML PO SYRP: 5.0000 mL | ORAL_SOLUTION | Freq: Three times a day (TID) | ORAL | 0 refills | Status: DC | PRN

## 2018-03-13 NOTE — Progress Notes (Addendum)
Date:  03/13/2018   Name:  Margaret Wagner    DOB:  1990-11-09   MRN:  [id number]   Chief Complaint: Sinusitis (cough, head cong, facial pain, dark green production) Sinusitis  This is a new problem. The current episode started in the past 7 days (wednesday). The problem is unchanged. The maximum temperature recorded prior to her arrival was 100.4 - 100.9 F. The pain is moderate. Associated symptoms include chills, congestion, coughing, diaphoresis, ear pain, headaches, a hoarse voice, shortness of breath, sinus pressure, sneezing, a sore throat and swollen glands. Pertinent negatives include no neck pain. Past treatments include nothing. The treatment provided moderate relief.  Cough  This is a new problem. The current episode started in the past 7 days. The problem has been waxing and waning. The cough is productive of purulent sputum (yellow/green). Associated symptoms include chills, ear pain, a fever, headaches, nasal congestion, postnasal drip, a sore throat, shortness of breath and sweats. Pertinent negatives include no eye redness, myalgias, rash, rhinorrhea or wheezing. There is no history of environmental allergies.     Review of Systems  Constitutional: Positive for chills, diaphoresis and fever. Negative for fatigue and unexpected weight change.  HENT: Positive for congestion, ear pain, hoarse voice, postnasal drip, sinus pressure, sneezing and sore throat. Negative for ear discharge and rhinorrhea.   Eyes: Negative for photophobia, pain, discharge, redness and itching.  Respiratory: Positive for cough and shortness of breath. Negative for wheezing and stridor.   Gastrointestinal: Negative for abdominal pain, blood in stool, constipation, diarrhea, nausea and vomiting.  Endocrine: Negative for cold intolerance, heat intolerance, polydipsia, polyphagia and polyuria.  Genitourinary: Negative for dysuria, flank pain, frequency, hematuria, menstrual problem, pelvic pain, urgency,  vaginal bleeding and vaginal discharge.  Musculoskeletal: Negative for arthralgias, back pain, myalgias and neck pain.  Skin: Negative for rash.  Allergic/Immunologic: Negative for environmental allergies and food allergies.  Neurological: Positive for headaches. Negative for dizziness, weakness, light-headedness and numbness.  Hematological: Negative for adenopathy. Does not bruise/bleed easily.  Psychiatric/Behavioral: Negative for dysphoric mood. The patient is not nervous/anxious.     Patient Active Problem List   Diagnosis Date Noted  . Anxiety and depression 01/07/2017    No Known Allergies  Past Surgical History:  Procedure Laterality Date  . CESAREAN SECTION      Social History   Tobacco Use  . Smoking status: Never Smoker  . Smokeless tobacco: Never Used  Substance Use Topics  . Alcohol use: Yes    Alcohol/week: 0.0 standard drinks    Comment: occasionally  . Drug use: No     Medication list has been reviewed and updated.  Current Meds  Medication Sig  . ALPRAZolam (XANAX) 0.25 MG tablet Take 1 tablet (0.25 mg total) by mouth 2 (two) times daily as needed for anxiety.  . celecoxib (CELEBREX) 200 MG capsule Take 1 capsule (200 mg total) by mouth daily.  . sertraline (ZOLOFT) 100 MG tablet Take 1 tablet (100 mg total) by mouth daily.    PHQ 2/9 Scores 03/13/2018 01/07/2017 05/08/2015 04/24/2015  PHQ - 2 Score 0 0 0 0  PHQ- 9 Score 1 1 - -    Physical Exam  Constitutional: She is oriented to person, place, and time. She appears well-developed and well-nourished.  HENT:  Head: Normocephalic.  Right Ear: Hearing, tympanic membrane, external ear and ear canal normal.  Left Ear: Tympanic membrane, external ear and ear canal normal.  Nose: Right sinus exhibits maxillary sinus tenderness. Right  sinus exhibits no frontal sinus tenderness. Left sinus exhibits maxillary sinus tenderness. Left sinus exhibits no frontal sinus tenderness.  Mouth/Throat: Uvula is midline,  oropharynx is clear and moist and mucous membranes are normal. No oropharyngeal exudate, posterior oropharyngeal edema or posterior oropharyngeal erythema.  Eyes: Pupils are equal, round, and reactive to light. Conjunctivae and EOM are normal. Lids are everted and swept, no foreign bodies found. Left eye exhibits no hordeolum. No foreign body present in the left eye. Right conjunctiva is not injected. Left conjunctiva is not injected. No scleral icterus.  Neck: Normal range of motion. Neck supple. No JVD present. No neck rigidity. No tracheal deviation present. No thyromegaly present.  Cardiovascular: Normal rate, regular rhythm, S1 normal, normal heart sounds, intact distal pulses and normal pulses. PMI is not displaced. Exam reveals no gallop, no S3, no S4 and no friction rub.  No murmur heard.  No systolic murmur is present.  No diastolic murmur is present. Pulses:      Carotid pulses are 2+ on the right side, and 2+ on the left side.      Radial pulses are 2+ on the right side, and 2+ on the left side.       Femoral pulses are 2+ on the right side, and 2+ on the left side.      Popliteal pulses are 2+ on the right side, and 2+ on the left side.       Dorsalis pedis pulses are 2+ on the right side, and 2+ on the left side.       Posterior tibial pulses are 2+ on the right side, and 2+ on the left side.  Pulmonary/Chest: Effort normal and breath sounds normal. No respiratory distress. She has no wheezes. She has no rales.  Abdominal: Soft. Normal aorta and bowel sounds are normal. She exhibits no mass. There is no hepatosplenomegaly. There is no tenderness. There is no rebound and no guarding.  Musculoskeletal: Normal range of motion. She exhibits no edema or tenderness.  Lymphadenopathy:       Head (right side): No submandibular adenopathy present.       Head (left side): No submandibular adenopathy present.    She has no cervical adenopathy.    She has no axillary adenopathy.  Neurological:  She is alert and oriented to person, place, and time. She has normal strength. She displays normal reflexes. No cranial nerve deficit.  Skin: Skin is warm and intact. No rash noted. No pallor.  Psychiatric: She has a normal mood and affect. Her mood appears not anxious. She does not exhibit a depressed mood.  Nursing note and vitals reviewed.   BP 108/70   Pulse 80   Ht 5' (1.524 m)   Wt 180 lb (81.6 kg)   LMP 02/21/2018 (Exact Date)   BMI 35.15 kg/m   Assessment and Plan:  1. Acute maxillary sinusitis, recurrence not specified Acute, start Amoxil 500 mg 3 times a day for 10 days. - amoxicillin (AMOXIL) 500 MG capsule; Take 1 capsule (500 mg total) by mouth 3 (three) times daily.  Dispense: 30 capsule; Refill: 0  2. cough Start Robitussin AC for cough as needed - guaiFENesin-codeine (ROBITUSSIN AC) 100-10 MG/5ML syrup; Take 5 mLs by mouth 3 (three) times daily as needed for cough.  Dispense: 150 mL; Refill: 0  Dr. Elizabeth Sauer [facility]  03/13/2018

## 2018-10-10 ENCOUNTER — Ambulatory Visit: Payer: BC Managed Care – PPO | Admitting: Family Medicine

## 2018-10-10 ENCOUNTER — Other Ambulatory Visit: Payer: Self-pay

## 2018-10-10 ENCOUNTER — Encounter: Payer: Self-pay | Admitting: Family Medicine

## 2018-10-10 VITALS — BP 128/80 | HR 72 | Ht 60.0 in | Wt 182.0 lb

## 2018-10-10 DIAGNOSIS — B349 Viral infection, unspecified: Secondary | ICD-10-CM

## 2018-10-10 MED ORDER — VALACYCLOVIR HCL 500 MG PO TABS: 500.0000 mg | ORAL_TABLET | Freq: Two times a day (BID) | ORAL | 2 refills | Status: DC

## 2018-10-10 NOTE — Progress Notes (Signed)
Date:  10/10/2018   Name:  Margaret GrillsStephanie L Jones   DOB:  1991/04/04   MRN:  [id number]   Chief Complaint: herpes outbreak (genital- started yesterday)  Rash This is a recurrent problem. The current episode started yesterday. The problem has been gradually worsening since onset. The affected locations include the genitalia. The rash is characterized by blistering. Pertinent negatives include no anorexia, congestion, cough, diarrhea, eye pain, facial edema, fatigue, fever, joint pain, nail changes, rhinorrhea, shortness of breath, sore throat or vomiting. The treatment provided mild relief.    Review of Systems  Constitutional: Negative.  Negative for chills, fatigue, fever and unexpected weight change.  HENT: Negative for congestion, ear discharge, ear pain, rhinorrhea, sinus pressure, sneezing and sore throat.   Eyes: Negative for photophobia, pain, discharge, redness and itching.  Respiratory: Negative for cough, shortness of breath, wheezing and stridor.   Gastrointestinal: Negative for abdominal pain, anorexia, blood in stool, constipation, diarrhea, nausea and vomiting.  Endocrine: Negative for cold intolerance, heat intolerance, polydipsia, polyphagia and polyuria.  Genitourinary: Negative for dysuria, flank pain, frequency, hematuria, menstrual problem, pelvic pain, urgency, vaginal bleeding and vaginal discharge.  Musculoskeletal: Negative for arthralgias, back pain, joint pain and myalgias.  Skin: Positive for rash. Negative for nail changes.  Allergic/Immunologic: Negative for environmental allergies and food allergies.  Neurological: Negative for dizziness, weakness, light-headedness, numbness and headaches.  Hematological: Negative for adenopathy. Does not bruise/bleed easily.  Psychiatric/Behavioral: Negative for dysphoric mood. The patient is not nervous/anxious.     Patient Active Problem List   Diagnosis Date Noted  . Anxiety and depression 01/07/2017    No Known  Allergies  Past Surgical History:  Procedure Laterality Date  . CESAREAN SECTION      Social History   Tobacco Use  . Smoking status: Never Smoker  . Smokeless tobacco: Never Used  Substance Use Topics  . Alcohol use: Yes    Alcohol/week: 0.0 standard drinks    Comment: occasionally  . Drug use: No     Medication list has been reviewed and updated.  Current Meds  Medication Sig  . ALPRAZolam (XANAX) 0.25 MG tablet Take 1 tablet (0.25 mg total) by mouth 2 (two) times daily as needed for anxiety.  . sertraline (ZOLOFT) 100 MG tablet Take 1 tablet (100 mg total) by mouth daily.    PHQ 2/9 Scores 10/10/2018 03/13/2018 01/07/2017 05/08/2015  PHQ - 2 Score 0 0 0 0  PHQ- 9 Score 0 1 1 -    BP Readings from Last 3 Encounters:  10/10/18 128/80  03/13/18 108/70  01/25/18 128/90    Physical Exam Vitals signs and nursing note reviewed.  Constitutional:      Appearance: She is well-developed.  HENT:     Head: Normocephalic.     Right Ear: Tympanic membrane, ear canal and external ear normal.     Left Ear: Tympanic membrane, ear canal and external ear normal.  Eyes:     General: Lids are everted, no foreign bodies appreciated. No scleral icterus.       Left eye: No foreign body or hordeolum.     Conjunctiva/sclera: Conjunctivae normal.     Right eye: Right conjunctiva is not injected.     Left eye: Left conjunctiva is not injected.     Pupils: Pupils are equal, round, and reactive to light.  Neck:     Musculoskeletal: Normal range of motion and neck supple.     Thyroid: No thyromegaly.  Vascular: No JVD.     Trachea: No tracheal deviation.  Cardiovascular:     Rate and Rhythm: Normal rate and regular rhythm.     Heart sounds: Normal heart sounds. No murmur. No friction rub. No gallop.   Pulmonary:     Effort: Pulmonary effort is normal. No respiratory distress.     Breath sounds: Normal breath sounds. No wheezing or rales.  Abdominal:     General: Bowel sounds are  normal.     Palpations: Abdomen is soft. There is no mass.     Tenderness: There is no abdominal tenderness. There is no guarding or rebound.  Musculoskeletal: Normal range of motion.        General: No tenderness.  Lymphadenopathy:     Cervical: No cervical adenopathy.  Skin:    General: Skin is warm.     Findings: Rash present. Rash is vesicular.  Neurological:     Mental Status: She is alert and oriented to person, place, and time.     Cranial Nerves: No cranial nerve deficit.     Deep Tendon Reflexes: Reflexes normal.  Psychiatric:        Mood and Affect: Mood is not anxious or depressed.     Wt Readings from Last 3 Encounters:  10/10/18 182 lb (82.6 kg)  03/13/18 180 lb (81.6 kg)  01/25/18 180 lb (81.6 kg)    BP 128/80   Pulse 72   Ht 5' (1.524 m)   Wt 182 lb (82.6 kg)   BMI 35.54 kg/m   Assessment and Plan:  1. Viral syndrome Patient with history recurrent HSV-2 infection.  Will initiate Valtrex 500 mg twice a day for 5 days.  This is sufficient given the current GFR.  Will give refills to cover for the following year. - valACYclovir (VALTREX) 500 MG tablet; Take 1 tablet (500 mg total) by mouth 2 (two) times daily.  Dispense: 10 tablet; Refill: 2

## 2019-08-02 ENCOUNTER — Telehealth: Payer: Self-pay | Admitting: Family Medicine

## 2019-08-02 NOTE — Telephone Encounter (Unsigned)
Copied from CRM [id number]. Topic: General - Other >> Aug 02, 2019  3:56 PM Elliot Gault wrote: Reason for CRM:   patient is having sinus issues, nasal drainage which is causing her to feel [place], patient states allergies. Patient employment is requesting a Dr. Note reflecting allergies is the cause of her symptoms or patient is required to take a COVID test. Patient requesting a call back from PCP nurse

## 2019-08-02 NOTE — Telephone Encounter (Signed)
Pt will have to get a covid test if that is what is being required. Dr Yetta Barre cannot say it is just allergies without her being tested and seeing a negative test. It sounds like there is more going on than just allergies

## 2019-08-03 NOTE — Telephone Encounter (Signed)
Left a vm for patient to callback 

## 2019-08-07 ENCOUNTER — Ambulatory Visit: Payer: Self-pay | Admitting: Family Medicine

## 2019-08-07 ENCOUNTER — Ambulatory Visit: Payer: BC Managed Care – PPO | Admitting: Family Medicine

## 2019-11-20 ENCOUNTER — Ambulatory Visit: Payer: Self-pay | Admitting: *Deleted

## 2019-11-20 NOTE — Telephone Encounter (Signed)
Pt called stating that she is having possible covid symptoms and is requesting to know if she should be tested. Pt states that she is sneezing, has a runny nose, watery eyes, possible fever. Please advise .   Call to patient- she states she started symptoms yesterday. She is being sent home from work today. Call to office- they have scheduled her an appointment in the morning. Patient is going home now and will cal triage nurses back with current temperature to be recorded for provider.  Reason for Disposition . [1] COVID-19 infection suspected by caller or triager AND [2] mild symptoms (cough, fever, or others) AND [3] no complications or SOB  Answer Assessment - Initial Assessment Questions 1. COVID-19 DIAGNOSIS: "Who made your Coronavirus (COVID-19) diagnosis?" "Was it confirmed by a positive lab test?" If not diagnosed by a HCP, ask "Are there lots of cases (community spread) where you live?" (See public health department website, if unsure)     No known exposure 2. COVID-19 EXPOSURE: "Was there any known exposure to COVID before the symptoms began?" CDC Definition of close contact: within 6 feet (2 meters) for a total of 15 minutes or more over a 24-hour period.      No known exposure 3. ONSET: "When did the COVID-19 symptoms start?"      yesterday 4. WORST SYMPTOM: "What is your worst symptom?" (e.g., cough, fever, shortness of breath, muscle aches)     Sneezing, watery eyes 5. COUGH: "Do you have a cough?" If Yes, ask: "How bad is the cough?"       Slight cough 6. FEVER: "Do you have a fever?" If Yes, ask: "What is your temperature, how was it measured, and when did it start?"     no 7. RESPIRATORY STATUS: "Describe your breathing?" (e.g., shortness of breath, wheezing, unable to speak)      normal 8. BETTER-SAME-WORSE: "Are you getting better, staying the same or getting worse compared to yesterday?"  If getting worse, ask, "In what way?"     Worse- more consistent symptoms 9. HIGH  RISK DISEASE: "Do you have any chronic medical problems?" (e.g., asthma, heart or lung disease, weak immune system, obesity, etc.)     no 10. PREGNANCY: "Is there any chance you are pregnant?" "When was your last menstrual period?"       No- LMP-due now 11. OTHER SYMPTOMS: "Do you have any other symptoms?"  (e.g., chills, fatigue, headache, loss of smell or taste, muscle pain, sore throat; new loss of smell or taste especially support the diagnosis of COVID-19)       no  Protocols used: CORONAVIRUS (COVID-19) DIAGNOSED OR SUSPECTED-A-AH

## 2019-11-21 ENCOUNTER — Ambulatory Visit: Payer: BC Managed Care – PPO | Admitting: Family Medicine

## 2019-11-21 ENCOUNTER — Encounter: Payer: Self-pay | Admitting: Family Medicine

## 2019-11-21 ENCOUNTER — Other Ambulatory Visit: Payer: Self-pay

## 2019-11-21 VITALS — BP 120/60 | HR 76 | Temp 98.6°F | Ht 60.0 in | Wt 179.0 lb

## 2019-11-21 DIAGNOSIS — J0100 Acute maxillary sinusitis, unspecified: Secondary | ICD-10-CM

## 2019-11-21 DIAGNOSIS — F419 Anxiety disorder, unspecified: Secondary | ICD-10-CM

## 2019-11-21 DIAGNOSIS — F329 Major depressive disorder, single episode, unspecified: Secondary | ICD-10-CM | POA: Diagnosis not present

## 2019-11-21 DIAGNOSIS — R509 Fever, unspecified: Secondary | ICD-10-CM

## 2019-11-21 DIAGNOSIS — F32A Depression, unspecified: Secondary | ICD-10-CM

## 2019-11-21 MED ORDER — AZITHROMYCIN 250 MG PO TABS: ORAL_TABLET | ORAL | 0 refills | Status: DC

## 2019-11-21 MED ORDER — BUSPIRONE HCL 5 MG PO TABS: 5.0000 mg | ORAL_TABLET | Freq: Two times a day (BID) | ORAL | 1 refills | Status: DC

## 2019-11-21 MED ORDER — BUPROPION HCL 75 MG PO TABS: 75.0000 mg | ORAL_TABLET | Freq: Two times a day (BID) | ORAL | 1 refills | Status: DC

## 2019-11-21 NOTE — Progress Notes (Signed)
Date:  11/21/2019   Name:  Margaret Wagner   DOB:  October 23, 1990   MRN:  [id number]   Chief Complaint: Sinusitis (sneezing, cong, drainage, sore throat, facial tenderness, headache) and Depression (PHQ=18 and GAD7=19)  Sinusitis This is a new problem. The current episode started in the past 7 days (monday). The problem is unchanged. Maximum temperature: 99.2. The fever has been present for 1 to 2 days. The pain is mild. Associated symptoms include congestion, headaches, sinus pressure and sneezing. Pertinent negatives include no chills, coughing, diaphoresis, ear pain, hoarse voice, neck pain, shortness of breath, sore throat or swollen glands. Treatments tried: antihistamine/ibuprofen. The treatment provided mild relief.  Depression        This is a recurrent problem.  The current episode started more than 1 month ago.   The onset quality is gradual.   The problem occurs intermittently.  The problem has been waxing and waning since onset.  Associated symptoms include decreased concentration, fatigue, helplessness, hopelessness, insomnia, irritable, restlessness, decreased interest, headaches and sad.  Associated symptoms include no appetite change, no body aches, no myalgias, no indigestion and no suicidal ideas.  Past treatments include SSRIs - Selective serotonin reuptake inhibitors (in past).  Compliance with treatment is variable.  Previous treatment provided mild relief.  Past medical history includes anxiety.   Anxiety Presents for follow-up visit. Symptoms include decreased concentration, excessive worry, insomnia, nervous/anxious behavior, palpitations and restlessness. Patient reports no chest pain, compulsions, confusion, depressed mood, dizziness, dry mouth, feeling of choking, hyperventilation, impotence, irritability, malaise, muscle tension, nausea, obsessions, panic, shortness of breath or suicidal ideas. Symptoms occur most days. The severity of symptoms is moderate.      Lab  Results  Component Value Date   CREATININE 0.84 09/29/2017   BUN 15 09/29/2017   NA 143 09/29/2017   K 4.1 09/29/2017   CL 104 09/29/2017   CO2 21 09/29/2017   Lab Results  Component Value Date   CHOL 146 09/29/2017   HDL 46 09/29/2017   LDLCALC 80 09/29/2017   TRIG 98 09/29/2017   Lab Results  Component Value Date   TSH 3.170 09/29/2017   No results found for: HGBA1C Lab Results  Component Value Date   WBC 6.4 09/29/2017   HGB 14.4 09/29/2017   HCT 42.2 09/29/2017   MCV 88 09/29/2017   PLT 330 09/29/2017   No results found for: ALT, AST, GGT, ALKPHOS, BILITOT   Review of Systems  Constitutional: Positive for fatigue. Negative for appetite change, chills, diaphoresis, fever, irritability and unexpected weight change.  HENT: Positive for congestion, sinus pressure and sneezing. Negative for ear discharge, ear pain, hoarse voice, rhinorrhea and sore throat.   Eyes: Negative for photophobia, pain, discharge, redness and itching.  Respiratory: Negative for cough, shortness of breath, wheezing and stridor.   Cardiovascular: Positive for palpitations. Negative for chest pain.  Gastrointestinal: Negative for abdominal pain, blood in stool, constipation, diarrhea, nausea and vomiting.  Endocrine: Negative for cold intolerance, heat intolerance, polydipsia, polyphagia and polyuria.  Genitourinary: Negative for dysuria, flank pain, frequency, hematuria, impotence, menstrual problem, pelvic pain, urgency, vaginal bleeding and vaginal discharge.  Musculoskeletal: Negative for arthralgias, back pain, myalgias and neck pain.  Skin: Negative for rash.  Allergic/Immunologic: Negative for environmental allergies and food allergies.  Neurological: Positive for headaches. Negative for dizziness, weakness, light-headedness and numbness.  Hematological: Negative for adenopathy. Does not bruise/bleed easily.  Psychiatric/Behavioral: Positive for decreased concentration and depression.  Negative for confusion, dysphoric mood  and suicidal ideas. The patient is nervous/anxious and has insomnia.     Patient Active Problem List   Diagnosis Date Noted  . Anxiety and depression 01/07/2017    No Known Allergies  Past Surgical History:  Procedure Laterality Date  . CESAREAN SECTION      Social History   Tobacco Use  . Smoking status: Never Smoker  . Smokeless tobacco: Never Used  Vaping Use  . Vaping Use: Never used  Substance Use Topics  . Alcohol use: Yes    Alcohol/week: 0.0 standard drinks    Comment: occasionally  . Drug use: No     Medication list has been reviewed and updated.  Current Meds  Medication Sig  . valACYclovir (VALTREX) 500 MG tablet Take 1 tablet (500 mg total) by mouth 2 (two) times daily.    PHQ 2/9 Scores 11/21/2019 10/10/2018 03/13/2018 01/07/2017  PHQ - 2 Score 6 0 0 0  PHQ- 9 Score 18 0 1 1    GAD 7 : Generalized Anxiety Score 11/21/2019  Nervous, Anxious, on Edge 3  Control/stop worrying 3  Worry too much - different things 3  Trouble relaxing 3  Restless 3  Easily annoyed or irritable 2  Afraid - awful might happen 2  Total GAD 7 Score 19  Anxiety Difficulty Very difficult    BP Readings from Last 3 Encounters:  11/21/19 120/60  10/10/18 128/80  03/13/18 108/70    Physical Exam Vitals and nursing note reviewed.  Constitutional:      General: She is irritable.     Appearance: She is well-developed.  HENT:     Head: Normocephalic.     Right Ear: Tympanic membrane, ear canal and external ear normal.     Left Ear: Tympanic membrane, ear canal and external ear normal.     Nose: Nose normal. No congestion or rhinorrhea.  Eyes:     General: Lids are everted, no foreign bodies appreciated. No scleral icterus.       Left eye: No foreign body or hordeolum.     Conjunctiva/sclera: Conjunctivae normal.     Right eye: Right conjunctiva is not injected.     Left eye: Left conjunctiva is not injected.     Pupils: Pupils are  equal, round, and reactive to light.  Neck:     Thyroid: No thyromegaly.     Vascular: No JVD.     Trachea: No tracheal deviation.  Cardiovascular:     Rate and Rhythm: Normal rate and regular rhythm.     Heart sounds: Normal heart sounds. No murmur heard.  No friction rub. No gallop.   Pulmonary:     Effort: Pulmonary effort is normal. No respiratory distress.     Breath sounds: Normal breath sounds. No wheezing or rales.  Abdominal:     General: Bowel sounds are normal.     Palpations: Abdomen is soft. There is no mass.     Tenderness: There is no abdominal tenderness. There is no guarding or rebound.  Musculoskeletal:        General: No tenderness. Normal range of motion.     Cervical back: Normal range of motion and neck supple.  Lymphadenopathy:     Cervical: No cervical adenopathy.  Skin:    General: Skin is warm.     Findings: No rash.  Neurological:     Mental Status: She is alert and oriented to person, place, and time.     Cranial Nerves: No cranial nerve deficit.  Deep Tendon Reflexes: Reflexes normal.  Psychiatric:        Mood and Affect: Mood is not anxious or depressed.     Wt Readings from Last 3 Encounters:  11/21/19 179 lb (81.2 kg)  10/10/18 182 lb (82.6 kg)  03/13/18 180 lb (81.6 kg)    BP 120/60   Pulse 76   Temp 98.6 F (37 C) (Oral)   Ht 5' (1.524 m)   Wt 179 lb (81.2 kg)   BMI 34.96 kg/m   Assessment and Plan:  1. Acute maxillary sinusitis, recurrence not specified New onset.  Acute.  Persistent.  Stable.  Exam and symptoms are consistent with sinus infection but we will also need because of the low grade fever to be evaluated for Covid.  In the meantime we will initiate a azithromycin 2 today followed by one a day for 4 days. - azithromycin (ZITHROMAX) 250 MG tablet; 2 today then 1 a day for 4 days  Dispense: 6 tablet; Refill: 0  2. Anxiety and depression Chronic.  Recurrent.  Patient has been treated with Zoloft in the past but they  are thinking about initiating a possibility of family in the future will initiate buspirone 5 mg twice a day and Wellbutrin 75 mg twice a day. - busPIRone (BUSPAR) 5 MG tablet; Take 1 tablet (5 mg total) by mouth 2 (two) times daily.  Dispense: 60 tablet; Refill: 1 - buPROPion (WELLBUTRIN) 75 MG tablet; Take 1 tablet (75 mg total) by mouth 2 (two) times daily.  Dispense: 60 tablet; Refill: 1 - Ambulatory referral to Psychiatry  3. Fever, unspecified fever cause Patient's had flu 24 h of low-grade fever 99 range.  Will refer to nearby next care for Covid testing.  Patient is to go to next care for testing if fever is to continue or if is not doing significantly better on antibiotics in 12 to 24 hours.

## 2019-11-22 ENCOUNTER — Telehealth: Payer: Self-pay

## 2019-11-22 NOTE — Telephone Encounter (Signed)
Spoke w/AMS. Although patients are regular, some variation in periods of up to 7 days (early or late) is normal. LMVM to notify patient per AMS to give it til Monday. If she still hasn't started her period, call us back and we'll go from there. Patient notified AMS out of office til Tuesday, will check for update at that time.

## 2019-11-22 NOTE — Telephone Encounter (Signed)
Secure chat to AMS who is in OR.

## 2019-11-22 NOTE — Telephone Encounter (Signed)
Patient reports she has paragard. Her periods have been very regular, normal bleeding. She is currently 5 days late for period. Two HPT's negative. Requesting [phone].

## 2019-12-03 ENCOUNTER — Telehealth: Payer: Self-pay | Admitting: Family Medicine

## 2019-12-03 ENCOUNTER — Other Ambulatory Visit: Payer: Self-pay

## 2019-12-03 DIAGNOSIS — F419 Anxiety disorder, unspecified: Secondary | ICD-10-CM

## 2019-12-03 DIAGNOSIS — F32A Depression, unspecified: Secondary | ICD-10-CM

## 2019-12-03 MED ORDER — ALPRAZOLAM 0.25 MG PO TABS: 0.2500 mg | ORAL_TABLET | Freq: Every evening | ORAL | 0 refills | Status: DC | PRN

## 2019-12-03 NOTE — Telephone Encounter (Signed)
Spoke to pt- called in Alprazolam # 14 to take as needed qhs and have advised her to call [facility] and get appt scheduled. Pt stated they did contact her, but she has been unable to schedule appt due to her job. She has been advised to get in with them before the Alprazolam runs out

## 2019-12-03 NOTE — Telephone Encounter (Signed)
Copied from CRM [id number]. Topic: General - Inquiry >> Dec 03, 2019  1:45 PM Leary Roca wrote: Reason for CRM: Pt is requesting a call back from [place] nurse regarding medication that she was prescribed last week for anxiety. Please advise

## 2020-01-20 ENCOUNTER — Other Ambulatory Visit: Payer: Self-pay | Admitting: Family Medicine

## 2020-01-20 DIAGNOSIS — F32A Depression, unspecified: Secondary | ICD-10-CM

## 2020-01-20 DIAGNOSIS — F419 Anxiety disorder, unspecified: Secondary | ICD-10-CM

## 2020-01-20 NOTE — Telephone Encounter (Signed)
Requested Prescriptions  Pending Prescriptions Disp Refills   buPROPion (WELLBUTRIN) 75 MG tablet [Pharmacy Med Name: BUPROPION HCL 75 MG TABLET] 180 tablet 1    Sig: TAKE 1 TABLET BY MOUTH TWICE A DAY     Psychiatry: Antidepressants - bupropion Passed - 01/20/2020 10:31 AM      Passed - Completed PHQ-2 or PHQ-9 in the last 360 days.      Passed - Last BP in normal range    BP Readings from Last 1 Encounters:  11/21/19 120/60         Passed - Valid encounter within last 6 months    Recent Outpatient Visits          2 months ago Acute maxillary sinusitis, recurrence not specified   [facility] Duanne Limerick, MD   1 year ago Viral syndrome   [facility] Duanne Limerick, MD   1 year ago Acute maxillary sinusitis, recurrence not specified   [facility] Duanne Limerick, MD   2 years ago Annual physical exam   [facility] Duanne Limerick, MD   3 years ago Anxiety and depression   [facility] Duanne Limerick, MD              busPIRone (BUSPAR) 5 MG tablet [Pharmacy Med Name: BUSPIRONE HCL 5 MG TABLET] 120 tablet 1    Sig: TAKE 1 TABLET BY MOUTH TWICE A DAY     Psychiatry: Anxiolytics/Hypnotics - Non-controlled Passed - 01/20/2020 10:31 AM      Passed - Valid encounter within last 6 months    Recent Outpatient Visits          2 months ago Acute maxillary sinusitis, recurrence not specified   [facility] Duanne Limerick, MD   1 year ago Viral syndrome   [facility] Duanne Limerick, MD   1 year ago Acute maxillary sinusitis, recurrence not specified   [facility] Duanne Limerick, MD   2 years ago Annual physical exam   [facility] Duanne Limerick, MD   3 years ago Anxiety and depression   [facility] Duanne Limerick, MD

## 2020-01-24 ENCOUNTER — Other Ambulatory Visit: Payer: Self-pay | Admitting: Family Medicine

## 2020-01-24 ENCOUNTER — Telehealth: Payer: Self-pay

## 2020-01-24 DIAGNOSIS — F32A Depression, unspecified: Secondary | ICD-10-CM

## 2020-01-24 DIAGNOSIS — F419 Anxiety disorder, unspecified: Secondary | ICD-10-CM

## 2020-01-24 NOTE — Telephone Encounter (Signed)
Requested medication (s) are due for refill today: Yes Requested medication (s) are on the active medication list: Yes  Last refill:  12/03/19  Future visit scheduled: No  Notes to clinic:  See request.    Requested Prescriptions  Pending Prescriptions Disp Refills   ALPRAZolam (XANAX) 0.25 MG tablet 14 tablet 0    Sig: Take 1 tablet (0.25 mg total) by mouth at bedtime as needed for anxiety.      Not Delegated - Psychiatry:  Anxiolytics/Hypnotics Failed - 01/24/2020  8:21 AM      Failed - This refill cannot be delegated      Failed - Urine Drug Screen completed in last 360 days.      Passed - Valid encounter within last 6 months    Recent Outpatient Visits           2 months ago Acute maxillary sinusitis, recurrence not specified   [facility] Duanne Limerick, MD   1 year ago Viral syndrome   [facility] Duanne Limerick, MD   1 year ago Acute maxillary sinusitis, recurrence not specified   [facility] Duanne Limerick, MD   2 years ago Annual physical exam   [facility] Duanne Limerick, MD   3 years ago Anxiety and depression   [facility] Duanne Limerick, MD

## 2020-01-24 NOTE — Telephone Encounter (Signed)
Requested medication (s) are due for refill today:yes  Requested medication (s) are on the active medication list: yes  Last refill: 12/03/19  #14  0 refills  Future visit scheduled no  Notes to clinic: not delegated  Requested Prescriptions  Pending Prescriptions Disp Refills   ALPRAZolam (XANAX) 0.25 MG tablet [Pharmacy Med Name: ALPRAZOLAM 0.25 MG TABLET] 14 tablet 0    Sig: TAKE 1 TABLET BY MOUTH AT BEDTIME AS NEEDED FOR ANXIETY      Not Delegated - Psychiatry:  Anxiolytics/Hypnotics Failed - 01/24/2020 12:46 PM      Failed - This refill cannot be delegated      Failed - Urine Drug Screen completed in last 360 days.      Passed - Valid encounter within last 6 months    Recent Outpatient Visits           2 months ago Acute maxillary sinusitis, recurrence not specified   [facility] Duanne Limerick, MD   1 year ago Viral syndrome   [facility] Duanne Limerick, MD   1 year ago Acute maxillary sinusitis, recurrence not specified   [facility] Duanne Limerick, MD   2 years ago Annual physical exam   [facility] Duanne Limerick, MD   3 years ago Anxiety and depression   [facility] Duanne Limerick, MD

## 2020-01-24 NOTE — Telephone Encounter (Signed)
Pt request refill   ALPRAZolam (XANAX) 0.25 MG tablet  [id number] - [place],  - [address] Phone:  [phone]  Fax:  [phone]

## 2020-01-24 NOTE — Telephone Encounter (Signed)
Copied from CRM [id number]. Topic: General - Inquiry >> Jan 24, 2020  4:03 PM Adrian Prince D wrote: Reason for CRM: Patient called and would like a call back in reference to a denied prescription at the pharmacy. She wouldn't go into detail, she just asked that she receive a call back. She can be reached at [phone]. Please advise

## 2020-01-25 NOTE — Telephone Encounter (Signed)
Called in Alprazolam and told pt to keep her [facility] appt for next week

## 2020-01-29 ENCOUNTER — Other Ambulatory Visit: Payer: Self-pay

## 2020-01-29 DIAGNOSIS — R079 Chest pain, unspecified: Secondary | ICD-10-CM | POA: Diagnosis present

## 2020-01-29 DIAGNOSIS — F419 Anxiety disorder, unspecified: Secondary | ICD-10-CM | POA: Diagnosis not present

## 2020-01-29 LAB — COMPREHENSIVE METABOLIC PANEL
ALT: 21 U/L (ref 0–44)
AST: 18 U/L (ref 15–41)
Albumin: 4.3 g/dL (ref 3.5–5.0)
Alkaline Phosphatase: 74 U/L (ref 38–126)
Anion gap: 9 (ref 5–15)
BUN: 13 mg/dL (ref 6–20)
CO2: 24 mmol/L (ref 22–32)
Calcium: 9.9 mg/dL (ref 8.9–10.3)
Chloride: 106 mmol/L (ref 98–111)
Creatinine, Ser: 0.71 mg/dL (ref 0.44–1.00)
GFR, Estimated: >60 mL/min (ref >60)
Glucose, Bld: 101 mg/dL — ABNORMAL HIGH (ref 70–99)
Potassium: 3.8 mmol/L (ref 3.5–5.1)
Sodium: 139 mmol/L (ref 135–145)
Total Bilirubin: 0.8 mg/dL (ref 0.3–1.2)
Total Protein: 7.5 g/dL (ref 6.5–8.1)

## 2020-01-29 LAB — CBC
HCT: 40.1 % (ref 36.0–46.0)
Hemoglobin: 13.9 g/dL (ref 12.0–15.0)
MCH: 30.2 pg (ref 26.0–34.0)
MCHC: 34.7 g/dL (ref 30.0–36.0)
MCV: 87.2 fL (ref 80.0–100.0)
Platelets: 357 10*3/uL (ref 150–400)
RBC: 4.6 MIL/uL (ref 3.87–5.11)
RDW: 12 % (ref 11.5–15.5)
WBC: 8.5 10*3/uL (ref 4.0–10.5)
nRBC: 0 % (ref 0.0–0.2)

## 2020-01-29 LAB — TROPONIN I (HIGH SENSITIVITY): Troponin I (High Sensitivity): <2 ng/L (ref <18)

## 2020-01-29 NOTE — ED Triage Notes (Signed)
Pt in with co chest pain with tingling to bilat hands and now worse to right arm. Pt has hx of panic attacks unsure if related. Pt still has tightness to chest and tingling to right arm, states has apptm with [facility] Thursday. States symptoms were not similar as past panic attacks. Pt denies any SI or HI at this time.

## 2020-01-30 ENCOUNTER — Emergency Department: Payer: BC Managed Care – PPO

## 2020-01-30 ENCOUNTER — Emergency Department
Admission: EM | Admit: 2020-01-30 | Discharge: 2020-01-30 | Disposition: A | Discharge status: Home / Self Care | Payer: BC Managed Care – PPO | Attending: Emergency Medicine | Admitting: Emergency Medicine

## 2020-01-30 DIAGNOSIS — F419 Anxiety disorder, unspecified: Secondary | ICD-10-CM

## 2020-01-30 DIAGNOSIS — R079 Chest pain, unspecified: Secondary | ICD-10-CM

## 2020-01-30 LAB — TROPONIN I (HIGH SENSITIVITY): Troponin I (High Sensitivity): <2 ng/L (ref <18)

## 2020-01-30 NOTE — ED Provider Notes (Signed)
[facility] Emergency Department Provider Note   ____________________________________________   First MD Initiated Contact with Patient 01/30/20 0421     (approximate)  I have reviewed the triage vital signs and the nursing notes.   HISTORY  Chief Complaint Chest Pain    HPI Margaret Wagner is a 29 y.o. female who presents to the ED from home with a chief complaint of chest pain and anxiety.  Patient has a history of panic attacks.  2 weeks ago patient stopped her Wellbutrin and BuSpar because she did not think they were effective.  She spoke with her PCP about this who referred her to [facility]; patient has an appointment tomorrow. PCP also started patient on Xanax which patient feels is helpful.  Reports around 7 PM patient began to have a sensation of chest tightness in her central chest.  This triggered a panic attack.  Patient thinks she was hyperventilating and subsequently felt tingling to her bilateral arms.  All symptoms have resolved.  Denies recent fever, cough, shortness of breath, abdominal pain, nausea, vomiting or dizziness.  Patient has IUD.  Denies recent travel or trauma.  Had COVID-19 infection in September.      Past Medical History:  Diagnosis Date  . Recurrent sinus infections    seasonal allergies  . Seasonal allergies     Patient Active Problem List   Diagnosis Date Noted  . Anxiety and depression 01/07/2017    Past Surgical History:  Procedure Laterality Date  . CESAREAN SECTION      Prior to Admission medications   Medication Sig Start Date End Date Taking? Authorizing Provider  ALPRAZolam (XANAX) 0.25 MG tablet Take 1 tablet (0.25 mg total) by mouth at bedtime as needed for anxiety. 12/03/19   Duanne Limerick, MD  azithromycin (ZITHROMAX) 250 MG tablet 2 today then 1 a day for 4 days 11/21/19   Duanne Limerick, MD  buPROPion (WELLBUTRIN) 75 MG tablet TAKE 1 TABLET BY MOUTH TWICE A DAY 01/20/20   Duanne Limerick, MD  busPIRone (BUSPAR) 5 MG tablet TAKE 1 TABLET BY MOUTH TWICE A DAY 01/20/20   Duanne Limerick, MD  sertraline (ZOLOFT) 100 MG tablet Take 1 tablet (100 mg total) by mouth daily. Patient not taking: Reported on 11/21/2019 09/21/17   Duanne Limerick, MD  valACYclovir (VALTREX) 500 MG tablet Take 1 tablet (500 mg total) by mouth 2 (two) times daily. 10/10/18   Duanne Limerick, MD    Allergies Patient has no known allergies.  Family History  Problem Relation Age of Onset  . Diabetes Father   . Heart disease Paternal Grandmother     Social History Social History   Tobacco Use  . Smoking status: Never Smoker  . Smokeless tobacco: Never Used  Vaping Use  . Vaping Use: Never used  Substance Use Topics  . Alcohol use: Yes    Alcohol/week: 0.0 standard drinks    Comment: occasionally  . Drug use: No    Review of Systems  Constitutional: No fever/chills Eyes: No visual changes. ENT: No sore throat. Cardiovascular: Positive for chest pain. Respiratory: Denies shortness of breath. Gastrointestinal: No abdominal pain.  No nausea, no vomiting.  No diarrhea.  No constipation. Genitourinary: Negative for dysuria. Musculoskeletal: Negative for back pain. Skin: Negative for rash. Neurological: Negative for headaches, focal weakness or numbness. Psychiatric: Positive for anxiety.   ____________________________________________   PHYSICAL EXAM:  VITAL SIGNS: ED Triage Vitals [01/29/20 2055]  Enc Vitals Group     BP (!) 191/102     Pulse Rate 83     Resp 18     Temp 97.9 F (36.6 C)     Temp Source Oral     SpO2 95 %     Weight 172 lb (78 kg)     Height 5' (1.524 m)     Head Circumference      Peak Flow      Pain Score 3     Pain Loc      Pain Edu?      Excl. in GC?     Constitutional: Alert and oriented. Well appearing and in no acute distress. Eyes: Conjunctivae are normal. PERRL. EOMI. Head: Atraumatic. Nose: No congestion/rhinnorhea. Mouth/Throat:  Mucous membranes are moist.   Neck: No stridor.   Cardiovascular: Normal rate, regular rhythm. Grossly normal heart sounds.  Good peripheral circulation. Respiratory: Normal respiratory effort.  No retractions. Lungs CTAB. Gastrointestinal: Soft and nontender. No distention. No abdominal bruits. No CVA tenderness. Musculoskeletal: No lower extremity tenderness nor edema.  No joint effusions. Neurologic:  Normal speech and language. No gross focal neurologic deficits are appreciated. No gait instability. Skin:  Skin is warm, dry and intact. No rash noted. Psychiatric: Mood and affect are normal. Speech and behavior are normal.  ____________________________________________   LABS (all labs ordered are listed, but only abnormal results are displayed)  Labs Reviewed  COMPREHENSIVE METABOLIC PANEL - Abnormal; Notable for the following components:      Result Value   Glucose, Bld 101 (*)    All other components within normal limits  CBC  TROPONIN I (HIGH SENSITIVITY)  TROPONIN I (HIGH SENSITIVITY)   ____________________________________________  EKG  ED ECG REPORT I, Deandrea Rion J, the attending physician, personally viewed and interpreted this ECG.   Date: 01/30/2020  EKG Time: 2052  Rate: 85  Rhythm: normal EKG, normal sinus rhythm  Axis: Normal  Intervals:none  ST&T Change: Nonspecific  ____________________________________________  RADIOLOGY I, Roniqua Kintz J, personally viewed and evaluated these images (plain radiographs) as part of my medical decision making, as well as reviewing the written report by the radiologist.  ED MD interpretation: No acute cardiopulmonary process  Official radiology report(s): DG Chest 2 View  Result Date: 01/30/2020 CLINICAL DATA:  29 year old female with chest pain. EXAM: CHEST - 2 VIEW COMPARISON:  None. FINDINGS: The heart size and mediastinal contours are within normal limits. Both lungs are clear. The visualized skeletal structures are  unremarkable. IMPRESSION: No active cardiopulmonary disease. Electronically Signed   By: Elgie Collard M.D.   On: 01/30/2020 00:24    ____________________________________________   PROCEDURES  Procedure(s) performed (including Critical Care):  Procedures   ____________________________________________   INITIAL IMPRESSION / ASSESSMENT AND PLAN / ED COURSE  As part of my medical decision making, I reviewed the following data within the electronic MEDICAL RECORD NUMBER Nursing notes reviewed and incorporated, Labs reviewed, EKG interpreted, Old chart reviewed (telephone communication 01/24/2020 with patient's PCP) (telephone communication with her PCP 01/24/2020), Radiograph reviewed and Notes from prior ED visits     29 year old female presenting with chest pain and anxiety.29 year old female presenting with chest pain and anxiety. Differential diagnosis includes, but is not limited to, ACS, aortic dissection, pulmonary embolism, cardiac tamponade, pneumothorax, pneumonia, pericarditis, myocarditis, GI-related causes including esophagitis/gastritis, and musculoskeletal chest wall pain.    Laboratory including 2 sets of troponins, EKG and imaging results unremarkable.  Low suspicion for PE given patient is not  tachycardic, tachypneic nor hypoxic.  Encouraged her to keep her appointment with [facility] in 1 day.  Strict return precautions given.  Patient verbalizes understanding and agrees with plan of care.      ____________________________________________   FINAL CLINICAL IMPRESSION(S) / ED DIAGNOSES  Final diagnoses:  Nonspecific chest pain  Anxiety     ED Discharge Orders    None      *Please note:  RHEN DOSSANTOS was evaluated in Emergency Department on 01/30/2020 for the symptoms described in the history of present illness. She was evaluated in the context of the global COVID-19 pandemic, which necessitated consideration that the patient might be at risk for infection with the  SARS-CoV-2 virus that causes COVID-19. Institutional protocols and algorithms that pertain to the evaluation of patients at risk for COVID-19 are in a state of rapid change based on information released by regulatory bodies including the CDC and federal and state organizations. These policies and algorithms were followed during the patient's care in the ED.  Some ED evaluations and interventions may be delayed as a result of limited staffing during and the pandemic.*   Note:  This document was prepared using Dragon voice recognition software and may include unintentional dictation errors.   Irean Hong, MD 01/30/20 [phone]

## 2020-01-30 NOTE — ED Notes (Signed)
Pt states off of welbutrin and buspar for a couple weeks, following up with PCP 10/21.

## 2020-01-30 NOTE — Discharge Instructions (Signed)
You may take your Xanax as needed for anxiety/panic attack.  Return to the ER for worsening symptoms, persistent vomiting, difficulty breathing or other concerns.

## 2020-03-13 ENCOUNTER — Ambulatory Visit: Payer: BLUE CROSS/BLUE SHIELD | Admitting: Obstetrics and Gynecology

## 2020-03-18 ENCOUNTER — Other Ambulatory Visit: Payer: Self-pay

## 2020-03-18 ENCOUNTER — Encounter: Payer: Self-pay | Admitting: Family Medicine

## 2020-03-18 DIAGNOSIS — B349 Viral infection, unspecified: Secondary | ICD-10-CM

## 2020-03-18 MED ORDER — VALACYCLOVIR HCL 500 MG PO TABS: 500.0000 mg | ORAL_TABLET | Freq: Two times a day (BID) | ORAL | 1 refills | Status: AC

## 2020-03-27 ENCOUNTER — Ambulatory Visit: Payer: BLUE CROSS/BLUE SHIELD | Admitting: Obstetrics and Gynecology

## 2020-06-16 ENCOUNTER — Ambulatory Visit (INDEPENDENT_AMBULATORY_CARE_PROVIDER_SITE_OTHER): Payer: BC Managed Care – PPO | Admitting: Obstetrics and Gynecology

## 2020-06-16 ENCOUNTER — Other Ambulatory Visit (HOSPITAL_COMMUNITY)
Admission: RE | Admit: 2020-06-16 | Discharge: 2020-06-16 | Disposition: A | Discharge status: Home / Self Care | Payer: BC Managed Care – PPO | Source: Ambulatory Visit | Attending: Obstetrics and Gynecology | Admitting: Obstetrics and Gynecology

## 2020-06-16 ENCOUNTER — Encounter: Payer: Self-pay | Admitting: Obstetrics and Gynecology

## 2020-06-16 ENCOUNTER — Other Ambulatory Visit: Payer: Self-pay

## 2020-06-16 VITALS — BP 122/72 | Ht 60.0 in | Wt 176.5 lb

## 2020-06-16 DIAGNOSIS — Z124 Encounter for screening for malignant neoplasm of cervix: Secondary | ICD-10-CM | POA: Insufficient documentation

## 2020-06-16 DIAGNOSIS — Z30431 Encounter for routine checking of intrauterine contraceptive device: Secondary | ICD-10-CM

## 2020-06-16 DIAGNOSIS — Z1239 Encounter for other screening for malignant neoplasm of breast: Secondary | ICD-10-CM

## 2020-06-16 DIAGNOSIS — Z01419 Encounter for gynecological examination (general) (routine) without abnormal findings: Secondary | ICD-10-CM

## 2020-06-16 NOTE — Progress Notes (Signed)
Gynecology Annual Exam   PCP: Duanne Limerick, MD  Chief Complaint:  Chief Complaint  Patient presents with  . Gynecologic Exam    Annual - no concerns. RM 5    History of Present Illness: Patient is a 30 y.o. G1P1 presents for annual exam. The patient has no complaints today.   LMP: Patient's last menstrual period was 05/29/2020. Regular monthly no menstrual concerns.  The patient is sexually active. She currently uses IUD (Paragard) for contraception. She denies dyspareunia.  The patient does perform self breast exams.  There is no notable family history of breast or ovarian cancer in her family.  The patient wears seatbelts: yes.   The patient has regular exercise: not asked.    The patient denies current symptoms of depression.  Stable on current dose of Lexapro.   Review of Systems: Review of Systems  Constitutional: Negative for chills and fever.  HENT: Negative for congestion.   Respiratory: Negative for cough and shortness of breath.   Cardiovascular: Negative for chest pain and palpitations.  Gastrointestinal: Negative for abdominal pain, constipation, diarrhea, heartburn, nausea and vomiting.  Genitourinary: Negative for dysuria, frequency and urgency.  Skin: Negative for itching and rash.  Neurological: Negative for dizziness and headaches.  Endo/Heme/Allergies: Negative for polydipsia.  Psychiatric/Behavioral: Negative for depression.    Past Medical History:  Patient Active Problem List   Diagnosis Date Noted  . Anxiety and depression 01/07/2017    Past Surgical History:  Past Surgical History:  Procedure Laterality Date  . CESAREAN SECTION      Gynecologic History:  Patient's last menstrual period was 05/29/2020. Contraception: 07/22/2017 Paragard IUD Last Pap: Results were: 06/10/2016 NIL and HR HPV negative   Obstetric History: G1P1  Family History:  Family History  Problem Relation Age of Onset  . Diabetes Father   . Heart disease  Paternal Grandmother     Social History:  Social History   Socioeconomic History  . Marital status: Married    Spouse name: Not on file  . Number of children: Not on file  . Years of education: Not on file  . Highest education level: Not on file  Occupational History  . Not on file  Tobacco Use  . Smoking status: Never Smoker  . Smokeless tobacco: Never Used  Vaping Use  . Vaping Use: Never used  Substance and Sexual Activity  . Alcohol use: Yes    Alcohol/week: 0.0 standard drinks    Comment: occasionally  . Drug use: No  . Sexual activity: Yes    Birth control/protection: I.U.D.  Other Topics Concern  . Not on file  Social History Narrative  . Not on file   Social Determinants of Health   Financial Resource Strain: Not on file  Food Insecurity: Not on file  Transportation Needs: Not on file  Physical Activity: Not on file  Stress: Not on file  Social Connections: Not on file  Intimate Partner Violence: Not on file    Allergies:  No Known Allergies  Medications: Prior to Admission medications   Medication Sig Start Date End Date Taking? Authorizing Provider  ALPRAZolam (XANAX) 0.25 MG tablet Take 1 tablet (0.25 mg total) by mouth at bedtime as needed for anxiety. 12/03/19   Duanne Limerick, MD  azithromycin (ZITHROMAX) 250 MG tablet 2 today then 1 a day for 4 days 11/21/19   Duanne Limerick, MD  buPROPion (WELLBUTRIN) 75 MG tablet TAKE 1 TABLET BY MOUTH TWICE A DAY  01/20/20   Duanne Limerick, MD  busPIRone (BUSPAR) 5 MG tablet TAKE 1 TABLET BY MOUTH TWICE A DAY 01/20/20   Duanne Limerick, MD  sertraline (ZOLOFT) 100 MG tablet Take 1 tablet (100 mg total) by mouth daily. Patient not taking: Reported on 11/21/2019 09/21/17   Duanne Limerick, MD  valACYclovir (VALTREX) 500 MG tablet Take 1 tablet (500 mg total) by mouth 2 (two) times daily. 03/18/20   Duanne Limerick, MD    Physical Exam Vitals: Blood pressure 122/72, height 5' (1.524 m), weight 176 lb 8 oz (80.1  kg), last menstrual period 05/29/2020.  General: NAD HEENT: normocephalic, anicteric Thyroid: no enlargement, no palpable nodules Pulmonary: No increased work of breathing, CTAB Cardiovascular: RRR, distal pulses 2+ Breast: Breast symmetrical, no tenderness, no palpable nodules or masses, no skin or nipple retraction present, no nipple discharge.  No axillary or supraclavicular lymphadenopathy. Abdomen: NABS, soft, non-tender, non-distended.  Umbilicus without lesions.  No hepatomegaly, splenomegaly or masses palpable. No evidence of hernia  Genitourinary:  External: Normal external female genitalia.  Normal urethral meatus, normal Bartholin's and Skene's glands.    Vagina: Normal vaginal mucosa, no evidence of prolapse.    Cervix: Grossly normal in appearance, no bleeding, IUD strings visualized 2cm  Uterus: Non-enlarged, mobile, normal contour.  No CMT  Adnexa: ovaries non-enlarged, no adnexal masses  Rectal: deferred  Lymphatic: no evidence of inguinal lymphadenopathy Extremities: no edema, erythema, or tenderness Neurologic: Grossly intact Psychiatric: mood appropriate, affect full  Female chaperone present for pelvic and breast  portions of the physical exam  GAD 7 : Generalized Anxiety Score 06/16/2020 11/21/2019  Nervous, Anxious, on Edge 1 3  Control/stop worrying 1 3  Worry too much - different things 1 3  Trouble relaxing 1 3  Restless 1 3  Easily annoyed or irritable 1 2  Afraid - awful might happen 1 2  Total GAD 7 Score 7 19  Anxiety Difficulty Somewhat difficult Very difficult    Flowsheet Row [facility] Visit [facility]/16/2020 in [facility]  PHQ-9 Total Score 2       Immunization History  Administered Date(s) Administered  . Influenza,inj,Quad PF,6+ Mos 01/07/2017     Assessment: 30 y.o. G1P1 routine annual exam  Plan: Problem List Items Addressed This Visit   None   Visit Diagnoses    Encounter for gynecological examination without abnormal  finding    -  Primary   Screening for malignant neoplasm of cervix       Relevant Orders   Cytology - PAP   Breast screening       IUD check up          1) STI screening  was notoffered and therefore not obtained  2)  ASCCP guidelines and rational discussed.  Patient opts for every 3 years screening interval  3) Contraception - the patient is currently using ParaGard  IUD.  She is happy with her current form of contraception and plans to continue  4) Routine healthcare maintenance including cholesterol, diabetes screening discussed managed by PCP  5) Return in about 1 year (around 06/16/2021) for annual.   [name], MD, [facility], [facility] 06/16/2020, 1:41 PM

## 2020-06-18 LAB — CYTOLOGY - PAP
Comment: NEGATIVE
Diagnosis: NEGATIVE
High risk HPV: NEGATIVE

## 2020-07-24 ENCOUNTER — Ambulatory Visit
Admission: EM | Admit: 2020-07-24 | Discharge: 2020-07-24 | Disposition: A | Discharge status: Home / Self Care | Payer: BC Managed Care – PPO

## 2020-07-24 ENCOUNTER — Other Ambulatory Visit: Payer: Self-pay

## 2020-07-24 DIAGNOSIS — J069 Acute upper respiratory infection, unspecified: Secondary | ICD-10-CM | POA: Diagnosis not present

## 2020-07-24 MED ORDER — IPRATROPIUM BROMIDE 0.06 % NA SOLN: 2.0000 | Freq: Four times a day (QID) | NASAL | 12 refills | Status: AC

## 2020-07-24 NOTE — Discharge Instructions (Addendum)
Use the Atrovent nasal spray 4 times a day, 2 squirts in each nostril, as needed for nasal congestion and post-nasal drip.  Use OTC Allegra 180 mg daily to help with nasal congestion and mucous production.  Perform sinus irrigation 2-3 times a day with a Neilmed sinus rinse kit and distilled water. Do not use tap water.  Return for new or worsening symptoms.

## 2020-07-24 NOTE — ED Provider Notes (Signed)
[facility]    CSN: [id number] Arrival date & time: 07/24/20  1722      History   Chief Complaint Chief Complaint  Patient presents with  . Nasal Congestion  . Otalgia    right    HPI Margaret Wagner is a 30 y.o. female.   HPI   30 year old female here for evaluation of sinus congestion/pressure, PND, and Rt. Ear pain for 2-3 days. She denies fever, nasal discharge, ringing in her ears, dizziness, or cough.   Past Medical History:  Diagnosis Date  . Recurrent sinus infections    seasonal allergies  . Seasonal allergies     Patient Active Problem List   Diagnosis Date Noted  . Anxiety and depression 01/07/2017    Past Surgical History:  Procedure Laterality Date  . CESAREAN SECTION      OB History    Gravida  1   Para  1   Term      Preterm      AB      Living  1     SAB      IAB      Ectopic      Multiple      Live Births               Home Medications    Prior to Admission medications   Medication Sig Start Date End Date Taking? Authorizing Provider  clonazePAM (KLONOPIN) 0.5 MG tablet Take 0.25-0.5 mg by mouth daily. 05/09/20  Yes [provider]  escitalopram (LEXAPRO) 5 MG tablet Take 5 mg by mouth every morning. 04/25/20  Yes [provider]  hydrochlorothiazide (MICROZIDE) 12.5 MG capsule Take 12.5 mg by mouth daily. 04/22/20  Yes [provider]  hydrOXYzine (ATARAX/VISTARIL) 25 MG tablet Take by mouth. 01/31/20  Yes [provider]  ibuprofen (ADVIL) 600 MG tablet Take 600 mg by mouth every 6 (six) hours as needed. 07/21/20  Yes [provider]  ipratropium (ATROVENT) 0.06 % nasal spray Place 2 sprays into both nostrils 4 (four) times daily. 07/24/20  Yes Margarette Canada, NP  methocarbamol (ROBAXIN) 750 MG tablet Take 750 mg by mouth 3 (three) times daily. 07/21/20  Yes [provider]  paragard intrauterine copper IUD IUD by Intrauterine route. 03/24/20  Yes [provider]  traZODone (DESYREL) 50 MG tablet  06/06/20  Yes [provider]  valACYclovir (VALTREX) 500 MG tablet Take 1 tablet (500 mg total) by mouth 2 (two) times daily. 03/18/20   Juline Patch, MD    Family History Family History  Problem Relation Age of Onset  . Diabetes Father   . Heart disease Paternal Grandmother     Social History Social History   Tobacco Use  . Smoking status: Never Smoker  . Smokeless tobacco: Never Used  Vaping Use  . Vaping Use: Never used  Substance Use Topics  . Alcohol use: Yes    Alcohol/week: 0.0 standard drinks    Comment: occasionally  . Drug use: No     Allergies   Patient has no known allergies.   Review of Systems Review of Systems  Constitutional: Negative for activity change, appetite change and fever.  HENT: Positive for congestion, ear pain and postnasal drip. Negative for rhinorrhea, sneezing and sore throat.   Respiratory: Negative for cough, shortness of breath and wheezing.   Gastrointestinal: Negative for diarrhea and nausea.  Musculoskeletal: Negative for arthralgias and myalgias.  Skin: Negative for rash.  Neurological: Negative for headaches.  Hematological: Negative.   Psychiatric/Behavioral: Negative.      Physical Exam Triage Vital Signs ED Triage Vitals [07/24/20 1728]  Enc Vitals Group     BP      Pulse      Resp      Temp      Temp src      SpO2      Weight 175 lb (79.4 kg)     Height 5' (1.524 m)     Head Circumference      Peak Flow      Pain Score 3     Pain Loc      Pain Edu?      Excl. in [place]?    No data found.  Updated Vital Signs BP (!) 150/98 (BP Location: Left Arm)   Pulse 64   Temp 98.2 F (36.8 C) (Oral)   Resp 18   Ht 5' (1.524 m)   Wt 175 lb (79.4 kg)   SpO2 100%   BMI 34.18 kg/m   Visual Acuity Right Eye Distance:   Left Eye Distance:   Bilateral Distance:    Right Eye Near:   Left Eye Near:    Bilateral Near:     Physical Exam Vitals and  nursing note reviewed.  Constitutional:      General: She is not in acute distress.    Appearance: Normal appearance. She is not ill-appearing.  HENT:     Head: Normocephalic and atraumatic.     Right Ear: Tympanic membrane, ear canal and external ear normal. There is no impacted cerumen.     Left Ear: Tympanic membrane, ear canal and external ear normal. There is no impacted cerumen.     Nose: Congestion present. No rhinorrhea.     Mouth/Throat:     Mouth: Mucous membranes are moist.     Pharynx: Oropharynx is clear. No posterior oropharyngeal erythema.  Cardiovascular:     Rate and Rhythm: Normal rate and regular rhythm.     Pulses: Normal pulses.     Heart sounds: Normal heart sounds. No murmur heard.   Pulmonary:     Effort: Pulmonary effort is normal.     Breath sounds: Normal breath sounds. No wheezing, rhonchi or rales.  Musculoskeletal:     Cervical back: Normal range of motion and neck supple.  Lymphadenopathy:     Cervical: No cervical adenopathy.  Skin:    General: Skin is warm and dry.     Capillary Refill: Capillary refill takes less than 2 seconds.     Findings: No erythema or rash.  Neurological:     General: No focal deficit present.     Mental Status: She is alert and oriented to person, place, and time.  Psychiatric:        Mood and Affect: Mood normal.        Behavior: Behavior normal.        Thought Content: Thought content normal.        Judgment: Judgment normal.      UC Treatments / Results  Labs (all labs ordered are listed, but only abnormal results are displayed) Labs Reviewed - No data to display  EKG   Radiology No results found.  Procedures Procedures (including critical care time)  Medications Ordered in UC Medications - No data to display  Initial Impression / Assessment and Plan / UC Course  I have reviewed the triage vital signs and the nursing notes.  Pertinent labs & imaging results that were available during my care of  the patient were reviewed by me and considered in my medical decision making (see chart for details).   Patient is a very pleasant 30 year old female here for evaluation of sinus pressure and PND that has been going on for 2-3 days. PE reveals pearly gray TM's bilaterally with a normal light reflex. Nasal mucosa is erythematous and edematous with clear discharge. Posterior oropharynx is pink and moist with clear PND. No cervical lymphadenopathy present on exam. Lungs are CTA bilaterally. Patients exam is consistent with viral URI. Will treat nasal congestion with Ipratropium nasal spray, sinus rinse, and OTC Allegra 180 mg daily.    Final Clinical Impressions(s) / UC Diagnoses   Final diagnoses:  Upper respiratory tract infection, unspecified type     Discharge Instructions     Use the Atrovent nasal spray 4 times a day, 2 squirts in each nostril, as needed for nasal congestion and post-nasal drip.  Use OTC Allegra 180 mg daily to help with nasal congestion and mucous production.  Perform sinus irrigation 2-3 times a day with a Neilmed sinus rinse kit and distilled water. Do not use tap water.  Return for new or worsening symptoms.     ED Prescriptions    Medication Sig Dispense Auth. Provider   ipratropium (ATROVENT) 0.06 % nasal spray Place 2 sprays into both nostrils 4 (four) times daily. 15 mL Margarette Canada, NP     PDMP not reviewed this encounter.   Margarette Canada, NP 07/24/20 1754

## 2020-07-24 NOTE — ED Triage Notes (Signed)
Pt c/o sinus congestion and pressure for several days. She has now developed right ear pain that radiates into her neck. Pt denies cough, f/n/v/d or other symptoms.

## 2020-07-29 ENCOUNTER — Telehealth: Payer: Self-pay

## 2020-07-29 ENCOUNTER — Telehealth: Payer: Self-pay | Admitting: Emergency Medicine

## 2020-07-29 MED ORDER — AMOXICILLIN-POT CLAVULANATE 875-125 MG PO TABS: 1.0000 | ORAL_TABLET | Freq: Two times a day (BID) | ORAL | 0 refills | Status: AC

## 2020-07-29 NOTE — Telephone Encounter (Signed)
Waited on 07/24/2020 for nasal congestion and sinus pressure and treated for viral URI.  Patient reports that in the interim she has developed increased sinus pressure, facial pain, and green nasal discharge.  Patient has had symptoms for greater than a week at this point and will treat with a course of Augmentin.

## 2020-07-29 NOTE — Telephone Encounter (Signed)
Pt was seen last week for allergy type symptoms but has now developed facial pain and green sinus drainage. Thinks she has a sinus infection because the nasal sprays have not helped. Asking for ABX. Spoke to [employer] and he will send prescription for antibiotic to [facility]. Pt is aware that if she does not improve she is to return or follow up with PCP

## 2020-11-01 IMAGING — CR DG CHEST 2V
1 series · 2 of 2 positions shown · non-contrast
Comparison: None.

CLINICAL DATA: 29-year-old female with chest pain.

EXAM:
CHEST - 2 VIEW

[Series 1: dg chest 2 view · 0.14mm/px · 2 of 2 slices shown]
[im 1/2]
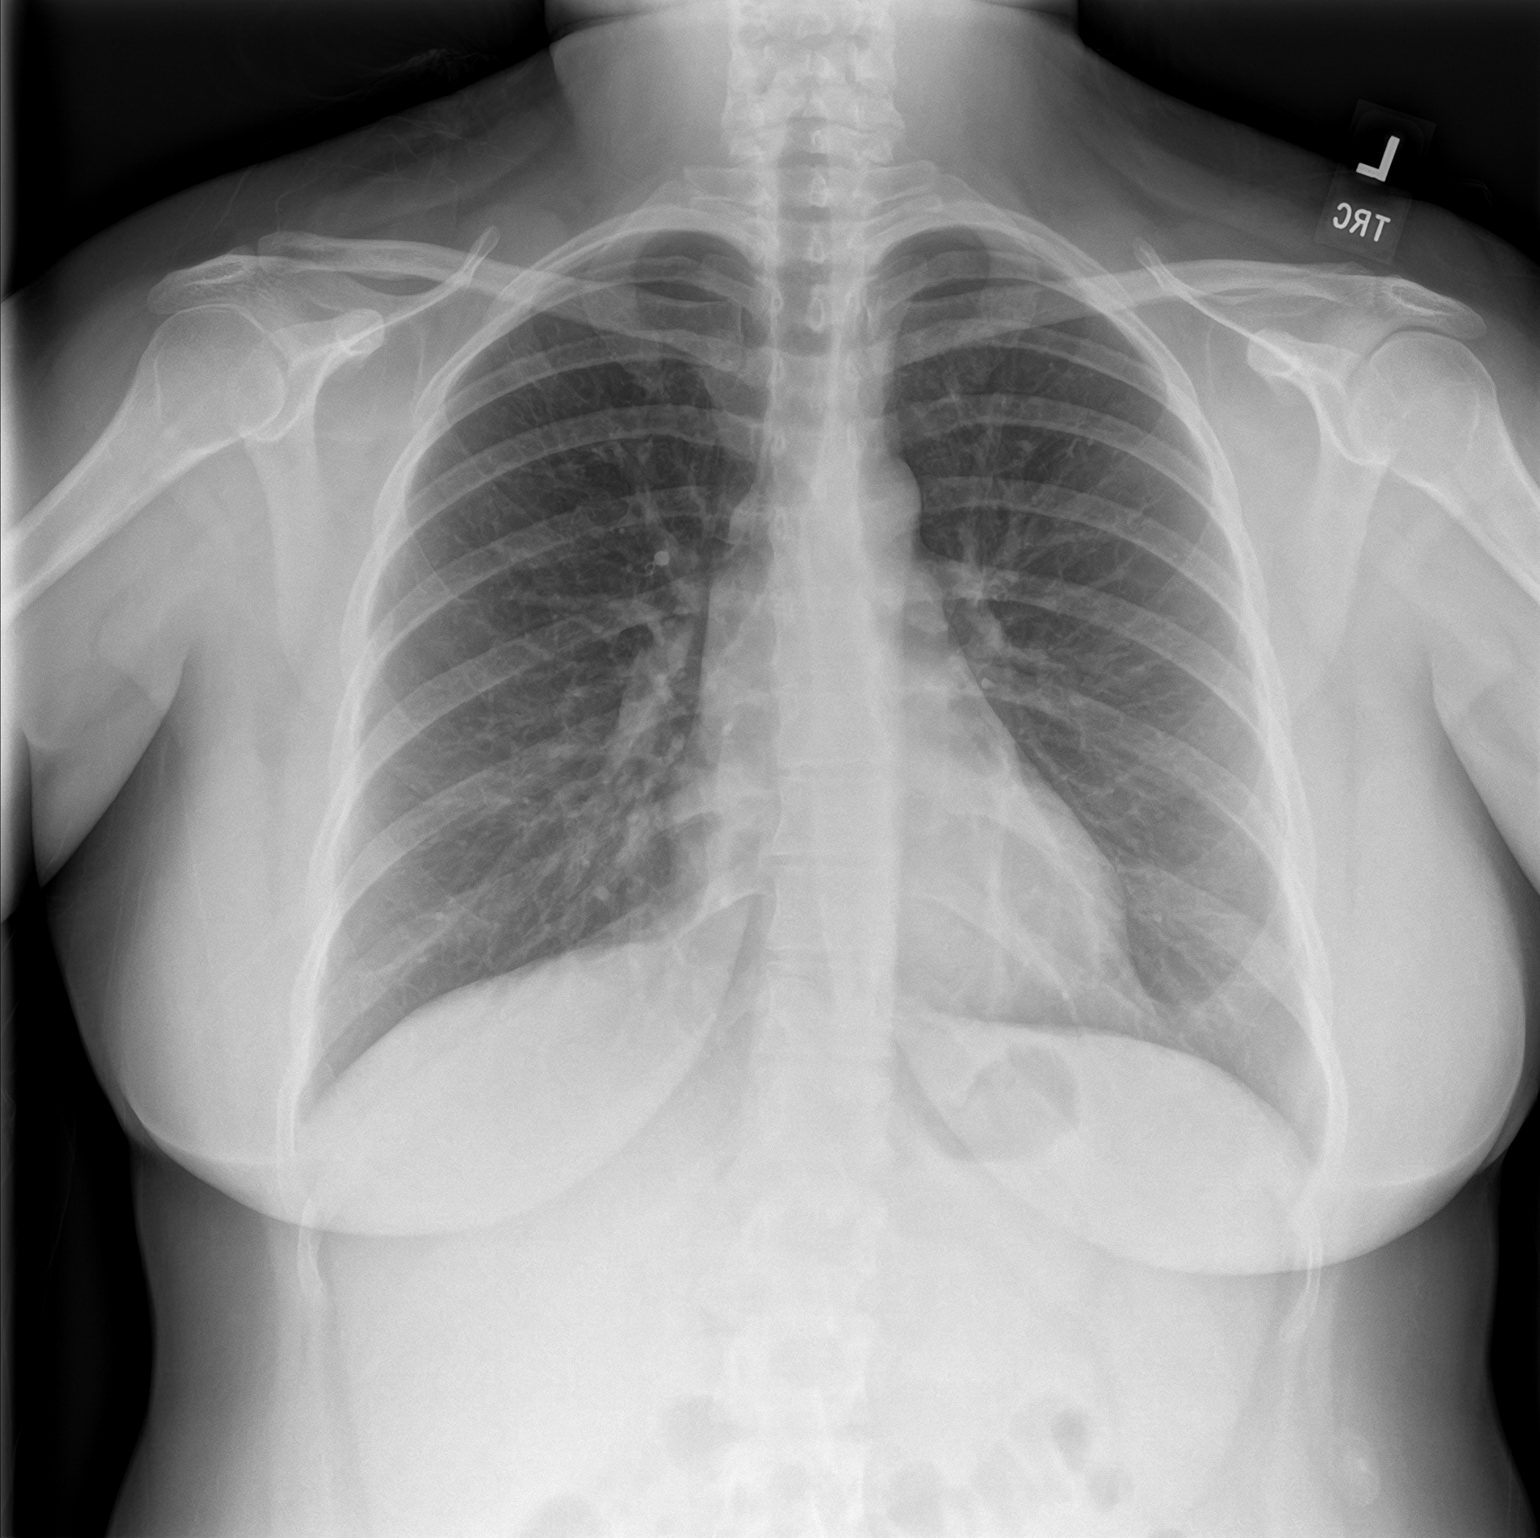
[im 2/2]
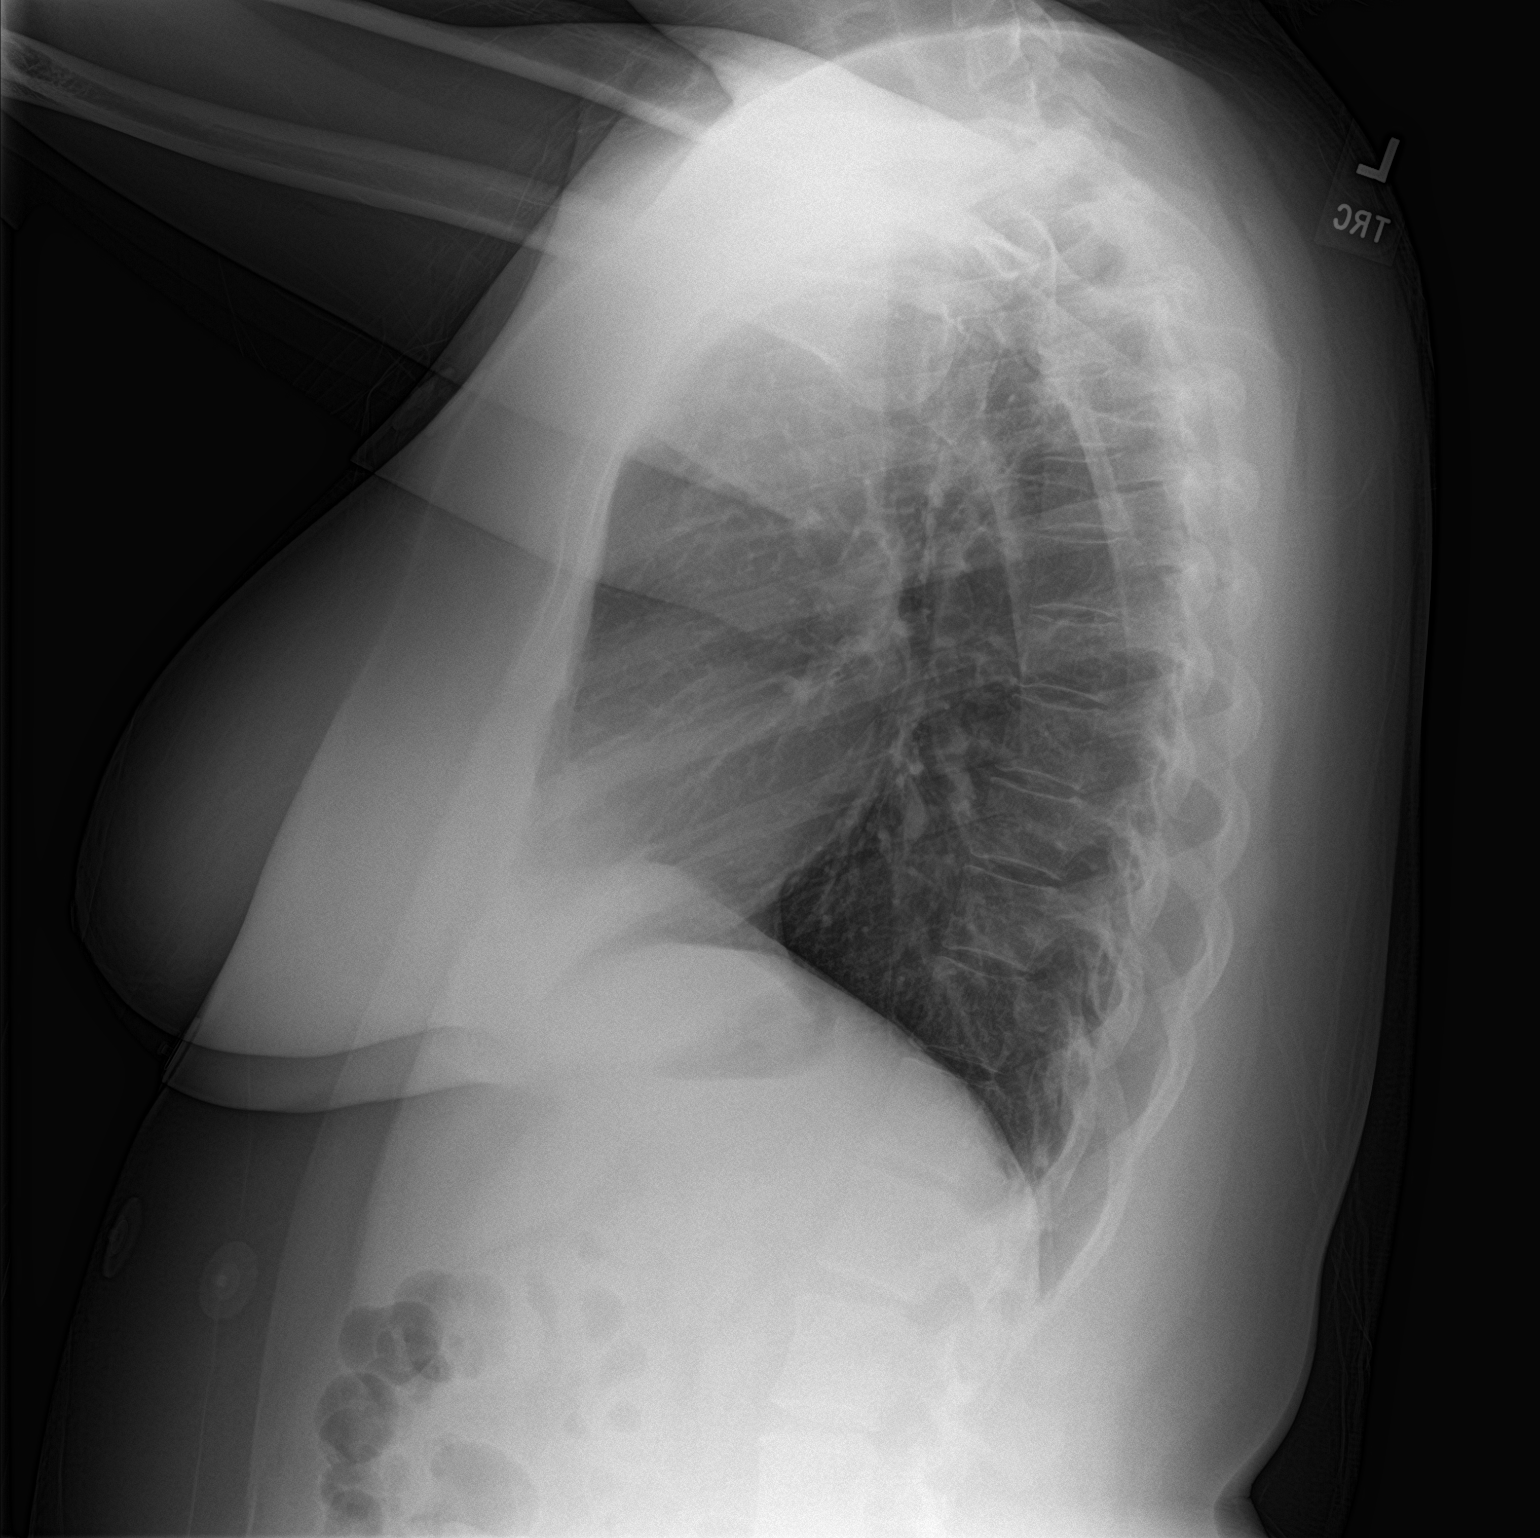

[2 of 2 positions shown; findings below may reference images not displayed]

FINDINGS: The heart size and mediastinal contours are within normal limits.
Both lungs are clear. The visualized skeletal structures are
unremarkable.
IMPRESSION: No active cardiopulmonary disease.

## 2021-08-04 ENCOUNTER — Ambulatory Visit
Admission: EM | Admit: 2021-08-04 | Discharge: 2021-08-04 | Disposition: A | Discharge status: Home / Self Care | Payer: BC Managed Care – PPO

## 2021-08-04 ENCOUNTER — Encounter: Payer: Self-pay | Admitting: Emergency Medicine

## 2021-08-04 DIAGNOSIS — J0110 Acute frontal sinusitis, unspecified: Secondary | ICD-10-CM

## 2021-08-04 MED ORDER — AMOXICILLIN-POT CLAVULANATE 875-125 MG PO TABS: 1.0000 | ORAL_TABLET | Freq: Two times a day (BID) | ORAL | 0 refills | Status: AC

## 2021-08-04 NOTE — Discharge Instructions (Signed)
Your symptoms today are consistent with a sinus infection, sinus infections are typically caused by viruses meaning we must give the body time to fight off the infection before attempting use of antibiotics ? ?Therefore please begin the following ? ?Take Flonase every morning, this medication is a steroid nasal spray which helps to loosen secretions out of the sinus passageway as well as to reduce the amount of secretions present ? ?Take Mucinex, this medication helps to thin secretions allowing them to drain ? ?Take an antihistamine such as Claritin or Zyrtec, this medication reduces the amount of secretions that the body will produce ? ?If symptoms have not improved after consistent use of the medicine on there will be an antibiotic at the pharmacy for you, on Sunday, August 09, 2021, begin use of Augmentin twice daily for 7 days ? ?You may follow-up with urgent care as needed for persisting symptoms  ?

## 2021-08-04 NOTE — ED Provider Notes (Signed)
?[facility] ? ? ? ?CSN: [id number] ?Arrival date & time: 08/04/21  1337 ? ? ?  ? ?History   ?Chief Complaint ?Chief Complaint  ?Patient presents with  ? Cough  ?  Sinus infection/allergies - Entered by patient  ? Nasal Congestion  ? ? ?HPI ?Margaret Wagner is a 31 y.o. female.  ? ?Patient presents with nasal congestion, rhinorrhea, right-sided ear pain, sinus pressure and discomfort, postnasal drip, nonproductive cough and generalized headache for 5 days.  Tolerating food and liquids.  No known sick contacts.  Has attempted use of ibuprofen and pseudoephedrine which has been minimally helpful.  History of seasonal allergies and recurrent sinus infections.  Denies fever, chills, body aches, shortness of breath, wheezing, chest pain or tightness. ? ?Past Medical History:  ?Diagnosis Date  ? Recurrent sinus infections   ? seasonal allergies  ? Seasonal allergies   ? ? ?Patient Active Problem List  ? Diagnosis Date Noted  ? Anxiety and depression 01/07/2017  ? ? ?Past Surgical History:  ?Procedure Laterality Date  ? CESAREAN SECTION    ? ? ?OB History   ? ? Gravida  ?1  ? Para  ?1  ? Term  ?   ? Preterm  ?   ? AB  ?   ? Living  ?1  ?  ? ? SAB  ?   ? IAB  ?   ? Ectopic  ?   ? Multiple  ?   ? Live Births  ?   ?   ?  ?  ? ? ? ?Home Medications   ? ?Prior to Admission medications   ?Medication Sig Start Date End Date Taking? Authorizing Provider  ?amoxicillin-clavulanate (AUGMENTIN) 875-125 MG tablet Take 1 tablet by mouth every 12 (twelve) hours. 08/09/21  Yes Keilee Denman R, NP  ?clonazePAM (KLONOPIN) 0.5 MG tablet Take 0.25-0.5 mg by mouth daily. 05/09/20  Yes [provider]  ?escitalopram (LEXAPRO) 5 MG tablet Take 5 mg by mouth every morning. 04/25/20  Yes [provider]  ?hydrochlorothiazide (MICROZIDE) 12.5 MG capsule Take 12.5 mg by mouth daily. 04/22/20  Yes [provider]  ?hydrOXYzine (ATARAX/VISTARIL) 25 MG tablet Take by mouth. 01/31/20  Yes [provider]   ?losartan (COZAAR) 50 MG tablet Take 1 tablet by mouth daily. 06/05/21  Yes [provider]  ?paragard intrauterine copper IUD IUD by Intrauterine route. 03/24/20  Yes [provider]  ?traZODone (DESYREL) 50 MG tablet  06/06/20  Yes [provider]  ?ibuprofen (ADVIL) 600 MG tablet Take 600 mg by mouth every 6 (six) hours as needed. 07/21/20   [provider]  ?ipratropium (ATROVENT) 0.06 % nasal spray Place 2 sprays into both nostrils 4 (four) times daily. 07/24/20   Margarette Canada, NP  ?methocarbamol (ROBAXIN) 750 MG tablet Take 750 mg by mouth 3 (three) times daily. 07/21/20   [provider]  ?valACYclovir (VALTREX) 500 MG tablet Take 1 tablet (500 mg total) by mouth 2 (two) times daily. 03/18/20   Juline Patch, MD  ? ? ?Family History ?Family History  ?Problem Relation Age of Onset  ? Diabetes Father   ? Heart disease Paternal Grandmother   ? ? ?Social History ?Social History  ? ?Tobacco Use  ? Smoking status: Never  ? Smokeless tobacco: Never  ?Vaping Use  ? Vaping Use: Never used  ?Substance Use Topics  ? Alcohol use: Yes  ?  Alcohol/week: 0.0 standard drinks  ?  Comment: occasionally  ?  Drug use: No  ? ? ? ?Allergies   ?Patient has no known allergies. ? ? ?Review of Systems ?Review of Systems ?Defer to HPI  ? ? ?Physical Exam ?Triage Vital Signs ?ED Triage Vitals  ?Enc Vitals Group  ?   BP 08/04/21 1402 126/90  ?   Pulse Rate 08/04/21 1402 (!) 109  ?   Resp 08/04/21 1402 18  ?   Temp 08/04/21 1402 98.9 ?F (37.2 ?C)  ?   Temp Source 08/04/21 1402 Oral  ?   SpO2 08/04/21 1402 95 %  ?   Weight --   ?   Height --   ?   Head Circumference --   ?   Peak Flow --   ?   Pain Score 08/04/21 1400 2  ?   Pain Loc --   ?   Pain Edu? --   ?   Excl. in [place]? --   ? ?No data found. ? ?Updated Vital Signs ?BP 126/90 (BP Location: Right Arm)   Pulse (!) 109   Temp 98.9 ?F (37.2 ?C) (Oral)   Resp 18   LMP 07/14/2021   SpO2 95%  ? ?Visual Acuity ?Right Eye Distance:   ?Left Eye  Distance:   ?Bilateral Distance:   ? ?Right Eye Near:   ?Left Eye Near:    ?Bilateral Near:    ? ?Physical Exam ?Constitutional:   ?   Appearance: Normal appearance.  ?HENT:  ?   Head: Normocephalic.  ?   Right Ear: Tympanic membrane, ear canal and external ear normal.  ?   Left Ear: Tympanic membrane, ear canal and external ear normal.  ?   Nose: Congestion present. No rhinorrhea.  ?   Right Turbinates: Swollen.  ?   Left Turbinates: Swollen.  ?   Right Sinus: Frontal sinus tenderness present. No maxillary sinus tenderness.  ?   Left Sinus: Maxillary sinus tenderness and frontal sinus tenderness present.  ?   Mouth/Throat:  ?   Mouth: Mucous membranes are moist.  ?   Pharynx: Oropharynx is clear.  ?Eyes:  ?   Extraocular Movements: Extraocular movements intact.  ?Cardiovascular:  ?   Rate and Rhythm: Normal rate and regular rhythm.  ?   Pulses: Normal pulses.  ?   Heart sounds: Normal heart sounds.  ?Pulmonary:  ?   Effort: Pulmonary effort is normal.  ?   Breath sounds: Normal breath sounds.  ?Musculoskeletal:  ?   Cervical back: Normal range of motion and neck supple.  ?Skin: ?   General: Skin is warm and dry.  ?Neurological:  ?   Mental Status: She is alert and oriented to person, place, and time. Mental status is at baseline.  ?Psychiatric:     ?   Mood and Affect: Mood normal.     ?   Behavior: Behavior normal.  ? ? ? ?UC Treatments / Results  ?Labs ?(all labs ordered are listed, but only abnormal results are displayed) ?Labs Reviewed - No data to display ? ?EKG ? ? ?Radiology ?No results found. ? ?Procedures ?Procedures (including critical care time) ? ?Medications Ordered in UC ?Medications - No data to display ? ?Initial Impression / Assessment and Plan / UC Course  ?I have reviewed the triage vital signs and the nursing notes. ? ?Pertinent labs & imaging results that were available during my care of the patient were reviewed by me and considered in my medical decision making (see chart for  details). ? ?Acute  nonrecurrent frontal sinusitis ? ?Vital signs are stable, O2 saturation 95% on room air, lungs are clear to auscultation, symptomology is consistent with a sinus infection, very however discussed at this time with only 5 days of illness etiology is most likely viral, recommended use of Flonase, antihistamine and Mucinex for management of symptoms, watchful wait antibiotic placed at pharmacy for day to 5 of illness if no improvement has been seen, Augmentin 7-day course prescribed, may follow-up with urgent care or PCP as needed for persisting symptoms ?Final Clinical Impressions(s) / UC Diagnoses  ? ?Final diagnoses:  ?Acute non-recurrent frontal sinusitis  ? ? ? ?Discharge Instructions   ? ?  ?Your symptoms today are consistent with a sinus infection, sinus infections are typically caused by viruses meaning we must give the body time to fight off the infection before attempting use of antibiotics ? ?Therefore please begin the following ? ?Take Flonase every morning, this medication is a steroid nasal spray which helps to loosen secretions out of the sinus passageway as well as to reduce the amount of secretions present ? ?Take Mucinex, this medication helps to thin secretions allowing them to drain ? ?Take an antihistamine such as Claritin or Zyrtec, this medication reduces the amount of secretions that the body will produce ? ?If symptoms have not improved after consistent use of the medicine on there will be an antibiotic at the pharmacy for you, on Sunday, August 09, 2021, begin use of Augmentin twice daily for 7 days ? ?You may follow-up with urgent care as needed for persisting symptoms  ? ? ?ED Prescriptions   ? ? Medication Sig Dispense Auth. Provider  ? amoxicillin-clavulanate (AUGMENTIN) 875-125 MG tablet Take 1 tablet by mouth every 12 (twelve) hours. 14 tablet Hans Eden, NP  ? ?  ? ?PDMP not reviewed this encounter. ?  ?Hans Eden, NP ?08/04/21 1527 ? ?

## 2021-08-04 NOTE — ED Triage Notes (Signed)
Pt presents with cough, runny nose, and sinus pressure x 5 days  ?

## 2023-03-14 ENCOUNTER — Emergency Department
Admission: EM | Admit: 2023-03-14 | Discharge: 2023-03-14 | Disposition: A | Discharge status: Home / Self Care | Payer: Commercial Managed Care - PPO | Attending: Emergency Medicine | Admitting: Emergency Medicine

## 2023-03-14 ENCOUNTER — Other Ambulatory Visit: Payer: Self-pay

## 2023-03-14 ENCOUNTER — Emergency Department: Payer: Commercial Managed Care - PPO

## 2023-03-14 ENCOUNTER — Encounter: Payer: Self-pay | Admitting: Emergency Medicine

## 2023-03-14 DIAGNOSIS — N132 Hydronephrosis with renal and ureteral calculous obstruction: Secondary | ICD-10-CM | POA: Insufficient documentation

## 2023-03-14 DIAGNOSIS — N200 Calculus of kidney: Secondary | ICD-10-CM

## 2023-03-14 DIAGNOSIS — I10 Essential (primary) hypertension: Secondary | ICD-10-CM | POA: Diagnosis not present

## 2023-03-14 DIAGNOSIS — R1032 Left lower quadrant pain: Secondary | ICD-10-CM | POA: Diagnosis present

## 2023-03-14 LAB — URINALYSIS, ROUTINE W REFLEX MICROSCOPIC
Bilirubin Urine: NEGATIVE
Glucose, UA: NEGATIVE mg/dL
Ketones, ur: NEGATIVE mg/dL
Nitrite: NEGATIVE
Protein, ur: NEGATIVE mg/dL
RBC / HPF: >50 RBC/hpf (ref 0–5)
Specific Gravity, Urine: 1.015 (ref 1.005–1.030)
pH: 5 (ref 5.0–8.0)

## 2023-03-14 LAB — CBC
HCT: 43.5 % (ref 36.0–46.0)
Hemoglobin: 14.7 g/dL (ref 12.0–15.0)
MCH: 30.4 pg (ref 26.0–34.0)
MCHC: 33.8 g/dL (ref 30.0–36.0)
MCV: 90.1 fL (ref 80.0–100.0)
Platelets: 356 10*3/uL (ref 150–400)
RBC: 4.83 MIL/uL (ref 3.87–5.11)
RDW: 11.9 % (ref 11.5–15.5)
WBC: 9.2 10*3/uL (ref 4.0–10.5)
nRBC: 0 % (ref 0.0–0.2)

## 2023-03-14 LAB — POC URINE PREG, ED: Preg Test, Ur: NEGATIVE

## 2023-03-14 LAB — COMPREHENSIVE METABOLIC PANEL
ALT: 24 U/L (ref 0–44)
AST: 18 U/L (ref 15–41)
Albumin: 4.2 g/dL (ref 3.5–5.0)
Alkaline Phosphatase: 65 U/L (ref 38–126)
Anion gap: 9 (ref 5–15)
BUN: 15 mg/dL (ref 6–20)
CO2: 24 mmol/L (ref 22–32)
Calcium: 10.4 mg/dL — ABNORMAL HIGH (ref 8.9–10.3)
Chloride: 107 mmol/L (ref 98–111)
Creatinine, Ser: 0.83 mg/dL (ref 0.44–1.00)
GFR, Estimated: >60 mL/min (ref >60)
Glucose, Bld: 94 mg/dL (ref 70–99)
Potassium: 3.8 mmol/L (ref 3.5–5.1)
Sodium: 140 mmol/L (ref 135–145)
Total Bilirubin: 0.7 mg/dL (ref <1.2)
Total Protein: 7.3 g/dL (ref 6.5–8.1)

## 2023-03-14 LAB — LIPASE, BLOOD: Lipase: 29 U/L (ref 11–51)

## 2023-03-14 MED ORDER — ACETAMINOPHEN 500 MG PO TABS
1000.0000 mg | ORAL_TABLET | Freq: Once | ORAL | Status: AC
  Administered 2023-03-14: 1000 mg via ORAL
  Filled 2023-03-14: qty 2

## 2023-03-14 MED ORDER — ONDANSETRON 4 MG PO TBDP
4.0000 mg | ORAL_TABLET | Freq: Once | ORAL | Status: AC
  Administered 2023-03-14: 4 mg via ORAL
  Filled 2023-03-14: qty 1

## 2023-03-14 MED ORDER — LIDOCAINE 5 % EX PTCH
1.0000 | MEDICATED_PATCH | Freq: Once | CUTANEOUS | Status: DC
Start: 2023-03-14 — End: 2023-03-14
  Administered 2023-03-14: 1 via TRANSDERMAL
  Filled 2023-03-14: qty 1

## 2023-03-14 MED ORDER — ONDANSETRON 4 MG PO TBDP: 4.0000 mg | ORAL_TABLET | Freq: Three times a day (TID) | ORAL | 0 refills | Status: AC | PRN

## 2023-03-14 MED ORDER — TAMSULOSIN HCL 0.4 MG PO CAPS: 0.4000 mg | ORAL_CAPSULE | Freq: Every day | ORAL | 0 refills | Status: AC

## 2023-03-14 MED ORDER — OXYCODONE HCL 5 MG PO TABS: 5.0000 mg | ORAL_TABLET | Freq: Four times a day (QID) | ORAL | 0 refills | Status: AC | PRN

## 2023-03-14 MED ORDER — KETOROLAC TROMETHAMINE 30 MG/ML IJ SOLN
30.0000 mg | Freq: Once | INTRAMUSCULAR | Status: AC
  Administered 2023-03-14: 30 mg via INTRAMUSCULAR
  Filled 2023-03-14: qty 1

## 2023-03-14 NOTE — ED Triage Notes (Signed)
Pt here with UTI symptoms x1 week. Pt states she went to UC and had negative test. Pt having urinary frequency and pain. Pt denies fever but having severe nausea. Pt ambulatory to triage.

## 2023-03-14 NOTE — Discharge Instructions (Addendum)
You have a kidney stone. See report below.   Take ibuprofen 600mg  every 8 hours daily (as long as you are not on any other blood thinners or have kidney disease) Take tylenol 1g every 8 hours daily. Take oxycodone for breakthrough pain. Do not drive, work, or operate machinery while on this.  Take zofran to help with nausea. Take Flomax to help dilate uretha. Call urology number above to schedule outpatient appointment. Return to ED for fevers, unable to keep food down, or any other concerns.     1. Mild left nephromegaly, hydronephrosis and hydroureter secondary  to a 2 mm stone at the left UVJ.  2. IUD is identified which appears located in the lower uterine  canal and cervix.   IMPRESSION: 1. Mild left nephromegaly, hydronephrosis and hydroureter secondary to a 2 mm stone at the left UVJ. 2. IUD is identified which appears located in the lower uterine canal and cervix.  Take oxycodone as prescribed. Do not drink alcohol, drive or participate in any other potentially dangerous activities while taking this medication as it may make you sleepy. Do not take this medication with any other sedating medications, either prescription or over-the-counter. If you were prescribed Percocet or Vicodin, do not take these with acetaminophen (Tylenol) as it is already contained within these medications.  This medication is an opiate (or narcotic) pain medication and can be habit forming. Use it as little as possible to achieve adequate pain control. Do not use or use it with extreme caution if you have a history of opiate abuse or dependence. If you are on a pain contract with your primary care doctor or a pain specialist, be sure to let them know you were prescribed this medication today from the Emergency Department. This medication is intended for your use only - do not give any to anyone else and keep it in a secure place where nobody else, especially children, have access to it.

## 2023-03-14 NOTE — ED Provider Notes (Signed)
[facility] Provider Note    Event Date/Time   First MD Initiated Contact with Patient 03/14/23 [phone]     (approximate)   History   Urinary Tract Infection   HPI  Margaret Wagner is a 32 y.o. female with anxiety, depression, hypertension, HSV who comes in with concerns for UTI.  I reviewed a office visit from 11/22 where patient had urinalysis that was done that was negative for UTI.  Patient reports that she went to urgent care today and was put on antibiotic but she has not started taking it.  She was told to come to the emergency room if she developed worsening pain.  She reports having left-sided flank pain radiating to her groin.  Denies any history of kidney stones.  Prior to this she reports having urinary frequency and urinary bladder discomfort but has had 2 prior negative UTIs.  Denies any known fevers.  Has not taken anything before coming in.  Does report noting a little bit of blood in her urine as well.      Physical Exam   Triage Vital Signs: ED Triage Vitals  Encounter Vitals Group     BP 03/14/23 0915 (!) 133/103     Systolic BP Percentile --      Diastolic BP Percentile --      Pulse Rate 03/14/23 0915 75     Resp 03/14/23 0915 16     Temp 03/14/23 0916 98.5 F (36.9 C)     Temp Source 03/14/23 0916 Oral     SpO2 03/14/23 0915 97 %     Weight 03/14/23 0915 175 lb 0.7 oz (79.4 kg)     Height 03/14/23 0915 5' (1.524 m)     Head Circumference --      Peak Flow --      Pain Score 03/14/23 0915 5     Pain Loc --      Pain Education --      Exclude from Growth Chart --     Most recent vital signs: Vitals:   03/14/23 0915 03/14/23 0916  BP: (!) 133/103   Pulse: 75   Resp: 16   Temp:  98.5 F (36.9 C)  SpO2: 97%      General: Awake, no distress.  CV:  Good peripheral perfusion.  Resp:  Normal effort.  Abd:  No distention.  Other:  Left CVA tenderness, no right lower quadrant pain   ED Results / Procedures / Treatments    Labs (all labs ordered are listed, but only abnormal results are displayed) Labs Reviewed  LIPASE, BLOOD  COMPREHENSIVE METABOLIC PANEL  CBC  URINALYSIS, ROUTINE W REFLEX MICROSCOPIC  POC URINE PREG, ED       RADIOLOGY I have reviewed the CT personally and interpreted + kidney stone    PROCEDURES:  Critical Care performed: No  Procedures   MEDICATIONS ORDERED IN ED: Medications  acetaminophen (TYLENOL) tablet 1,000 mg (has no administration in time range)  ketorolac (TORADOL) 30 MG/ML injection 30 mg (has no administration in time range)  lidocaine (LIDODERM) 5 % 1 patch (has no administration in time range)  ondansetron (ZOFRAN-ODT) disintegrating tablet 4 mg (has no administration in time range)     IMPRESSION / MDM / ASSESSMENT AND PLAN / ED COURSE  I reviewed the triage vital signs and the nursing notes.   Patient's presentation is most consistent with acute presentation with potential threat to life or bodily function.   Patient reports  some blood in her urine, left flank pain.  Suspect most likely kidney stone given her urines have been negative for UTI seems less likely pyelonephritis.  Will get CT imaging to evaluate and treat patient's symptoms.  Given she is driving we will start off with nonnarcotics.  Pregnancy test was negative.  No right lower quadrant pain to suggest appendicitis.    IMPRESSION: 1. Mild left nephromegaly, hydronephrosis and hydroureter secondary to a 2 mm stone at the left UVJ. 2. IUD is identified which appears located in the lower uterine canal and cervix.  preg test was negative.  Lipase normal CMP slightly elevated calcium.  CBC shows normal white count.  Urine had significant amount of squamous cells but more squamous than WBCs therefore I do not feel like this represents a coinfection.  I discussed with her and she states that urgent care they also told her that her urine did not show UTI they just put her on a Macrobid just  because of her symptoms.  We discussed the pros and cons of antibiotics at this point she would like to hold off but she understands to return for fevers.  At this time I suspect that her symptoms are related to the kidney stone and she is afebrile with normal white count.  We discussed treatment management and she expressed understanding felt comfortable with discharge home      FINAL CLINICAL IMPRESSION(S) / ED DIAGNOSES   Final diagnoses:  Kidney stone     Rx / DC Orders   ED Discharge Orders          Ordered    oxyCODONE (ROXICODONE) 5 MG immediate release tablet  Every 6 hours PRN        03/14/23 1044    ondansetron (ZOFRAN-ODT) 4 MG disintegrating tablet  Every 8 hours PRN        03/14/23 1044    tamsulosin (FLOMAX) 0.4 MG CAPS capsule  Daily        03/14/23 1044             Note:  This document was prepared using Dragon voice recognition software and may include unintentional dictation errors.   Concha Se, MD 03/14/23 1045

## 2023-03-14 NOTE — ED Notes (Signed)
See triage note  Presents with lower back/flank pain States pain started about 1 week ago Also had been having urinary freq and dysuria
